# Patient Record
Sex: Male | Born: 1980 | Race: Black or African American | Hispanic: No | Marital: Single | Smoking: Never smoker
Health system: Southern US, Community
[De-identification: ages and names within clinical notes are randomized; demographics above are authoritative.]

## PROBLEM LIST (undated history)

## (undated) DIAGNOSIS — I10 Essential (primary) hypertension: Secondary | ICD-10-CM

## (undated) DIAGNOSIS — K219 Gastro-esophageal reflux disease without esophagitis: Secondary | ICD-10-CM

## (undated) DIAGNOSIS — J301 Allergic rhinitis due to pollen: Secondary | ICD-10-CM

## (undated) HISTORY — DX: Essential (primary) hypertension: I10

## (undated) HISTORY — DX: Allergic rhinitis due to pollen: J30.1

## (undated) HISTORY — DX: Gastro-esophageal reflux disease without esophagitis: K21.9

## (undated) HISTORY — PX: HIP SURGERY: SHX245

---
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
## 2001-03-15 ENCOUNTER — Emergency Department (HOSPITAL_COMMUNITY): Admission: EM | Admit: 2001-03-15 | Discharge: 2001-03-15 | Payer: Self-pay | Admitting: Emergency Medicine

## 2005-02-19 HISTORY — PX: ORIF FINGER FRACTURE: SHX2122

## 2005-02-19 HISTORY — PX: ANKLE SURGERY: SHX546

## 2005-02-19 HISTORY — PX: HIP SURGERY: SHX245

## 2005-02-22 ENCOUNTER — Inpatient Hospital Stay (HOSPITAL_COMMUNITY): Admission: AC | Admit: 2005-02-22 | Discharge: 2005-02-28 | Payer: Self-pay
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
## 2005-02-25 ENCOUNTER — Ambulatory Visit: Payer: Self-pay | Admitting: Cardiology
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
## 2005-02-27 ENCOUNTER — Encounter: Payer: Self-pay | Admitting: Cardiology

## 2005-02-27 ENCOUNTER — Ambulatory Visit: Payer: Self-pay | Admitting: Cardiology

## 2005-03-06 ENCOUNTER — Encounter: Admission: RE | Admit: 2005-03-06 | Discharge: 2005-03-06 | Payer: Self-pay | Admitting: Orthopedic Surgery
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
## 2005-03-11 ENCOUNTER — Inpatient Hospital Stay (HOSPITAL_COMMUNITY): Admission: RE | Admit: 2005-03-11 | Discharge: 2005-03-14 | Payer: Self-pay | Admitting: Orthopedic Surgery

## 2005-03-11 ENCOUNTER — Ambulatory Visit: Payer: Self-pay | Admitting: Physical Medicine & Rehabilitation
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
## 2005-03-28 ENCOUNTER — Encounter: Admission: RE | Admit: 2005-03-28 | Discharge: 2005-05-13 | Payer: Self-pay | Admitting: Orthopedic Surgery

## 2005-04-24 ENCOUNTER — Encounter: Admission: RE | Admit: 2005-04-24 | Discharge: 2005-04-24 | Payer: Self-pay | Admitting: Orthopedic Surgery
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
## 2005-06-04 ENCOUNTER — Encounter: Admission: RE | Admit: 2005-06-04 | Discharge: 2005-06-04 | Payer: Self-pay | Admitting: Orthopedic Surgery
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
## 2005-06-12 ENCOUNTER — Inpatient Hospital Stay (HOSPITAL_COMMUNITY): Admission: RE | Admit: 2005-06-12 | Discharge: 2005-06-14 | Payer: Self-pay | Admitting: Orthopedic Surgery

## 2005-06-12 ENCOUNTER — Ambulatory Visit: Payer: Self-pay | Admitting: Physical Medicine & Rehabilitation
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
## 2005-12-31 ENCOUNTER — Encounter: Admission: RE | Admit: 2005-12-31 | Discharge: 2005-12-31 | Payer: Self-pay | Admitting: Orthopedic Surgery

## 2005-12-31 IMAGING — CT CT EXTREM LOW W/O CM*L*
3 of 4 series · 13 of 46 positions shown, 19 images · IV contrast (agent unspecified)
Comparison: Plain films of [DATE], CT of [DATE].  Outside plain films dated [DATE].

CLINICAL DATA: Evaluate nonunion of left femoral neck fracture.
CT OF THE LEFT HIP WITHOUT CONTRAST:
TECHNIQUE: Contiguous axial images of the left hip without contrast.  Sagittal and coronal reformats as well as 3D reconstructions were constructed for perspective.

[Series 2: — · axial · 0.70mm/px · z∈[-318,-48]mm · 9 of 89 slices shown, 15 images]
[im 9/89  soft-tissue]
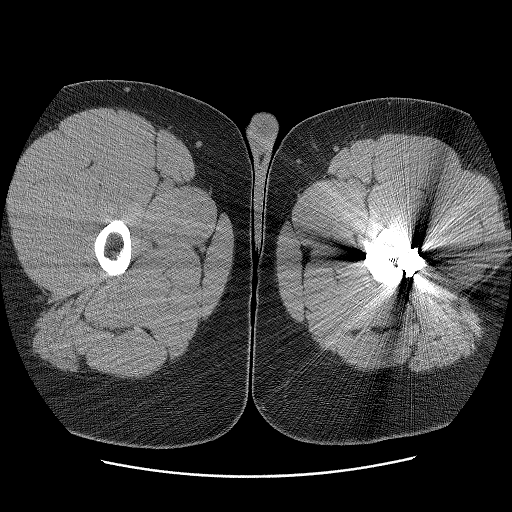
[im 9/89  bone]
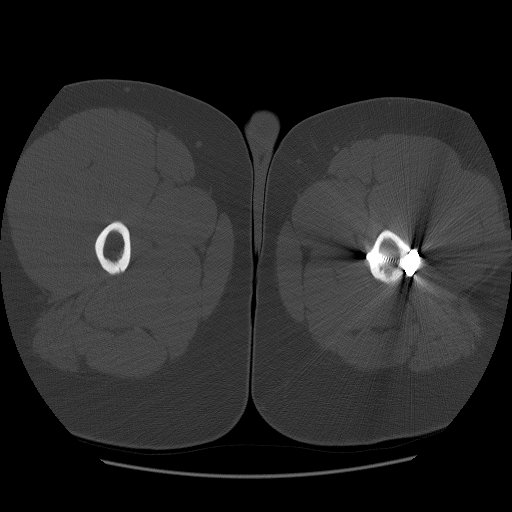
[im 18/89  soft-tissue]
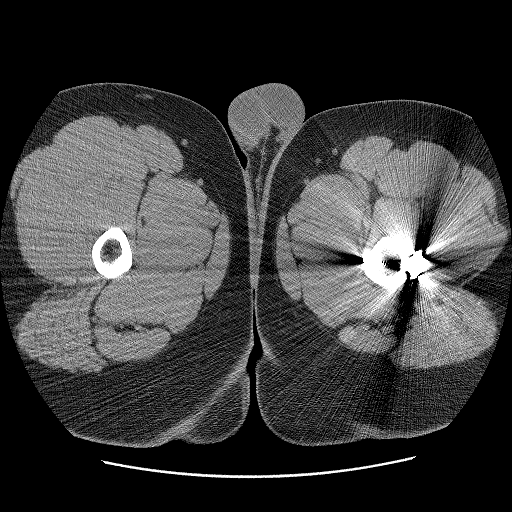
[im 27/89  soft-tissue]
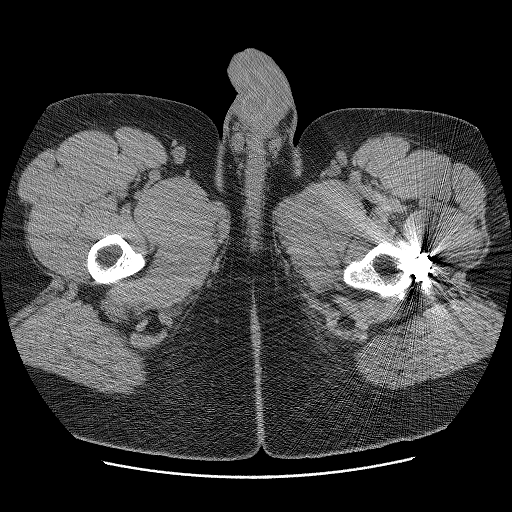
[im 36/89  soft-tissue]
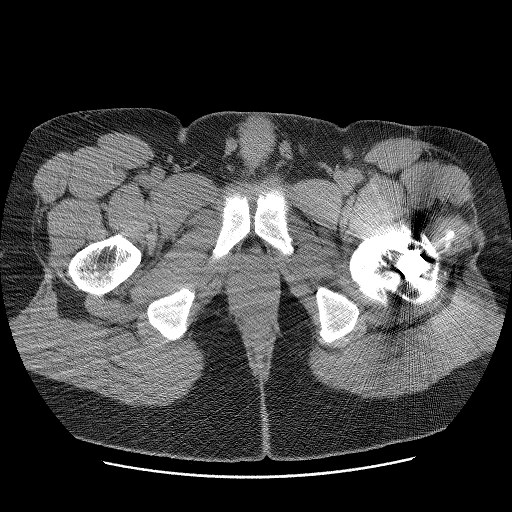
[im 45/89  soft-tissue]
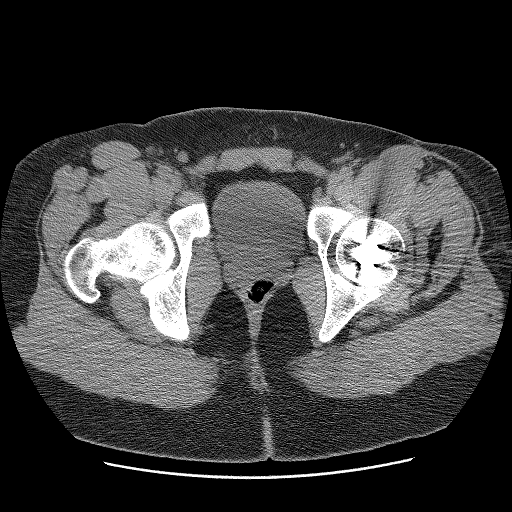
[im 53/89  soft-tissue]
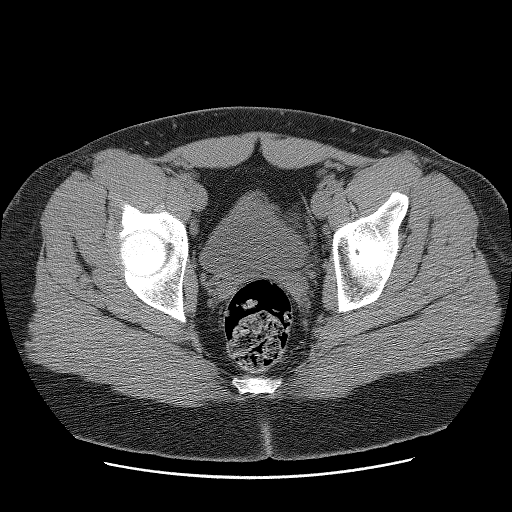
[im 53/89  lung]
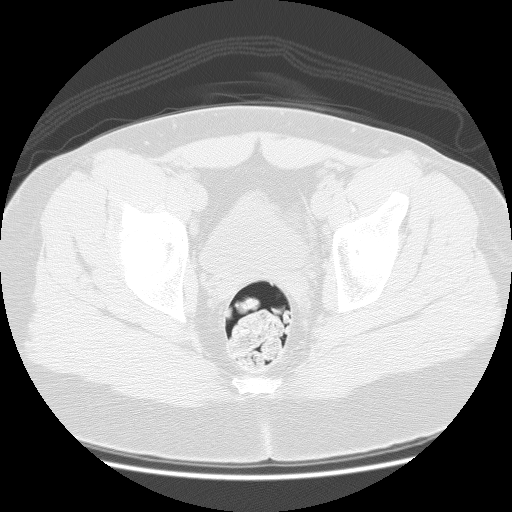
[im 62/89  soft-tissue]
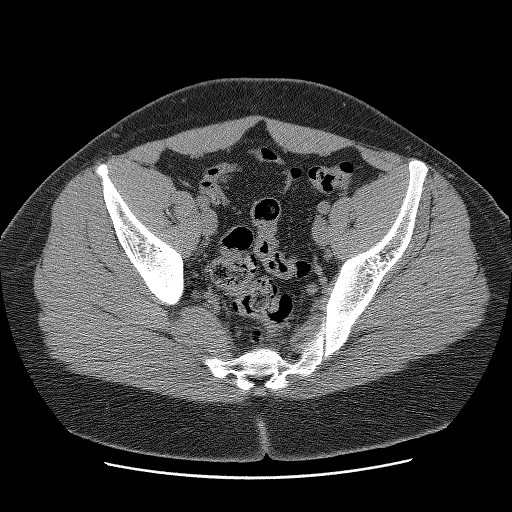
[im 62/89  lung]
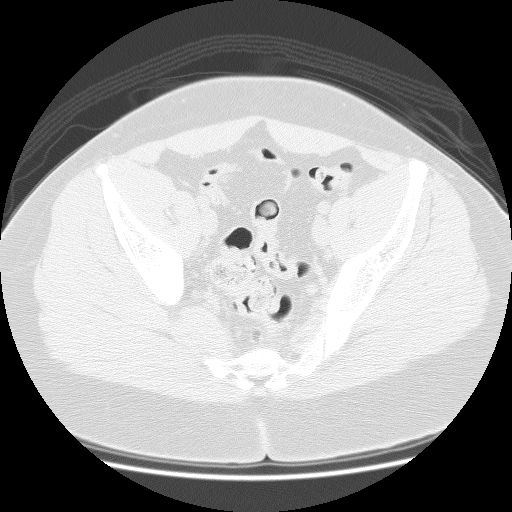
[im 71/89  soft-tissue]
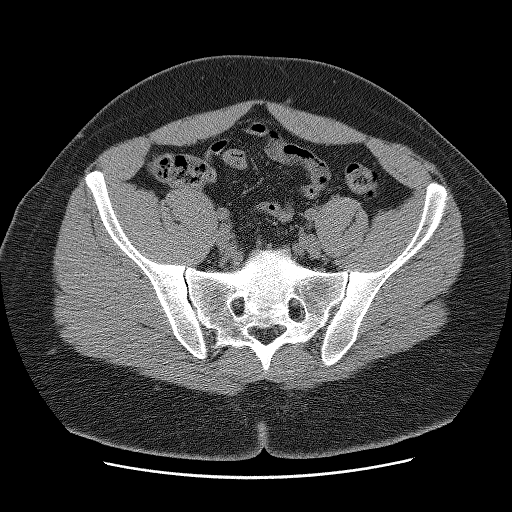
[im 71/89  lung]
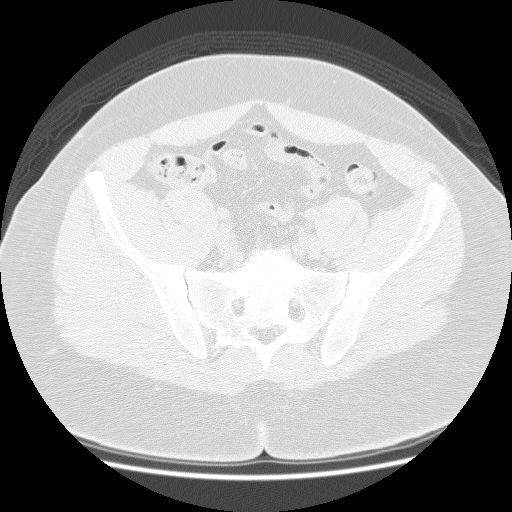
[im 80/89  soft-tissue]
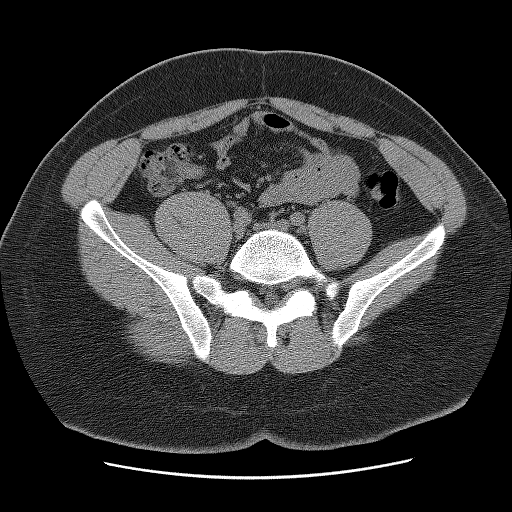
[im 80/89  lung]
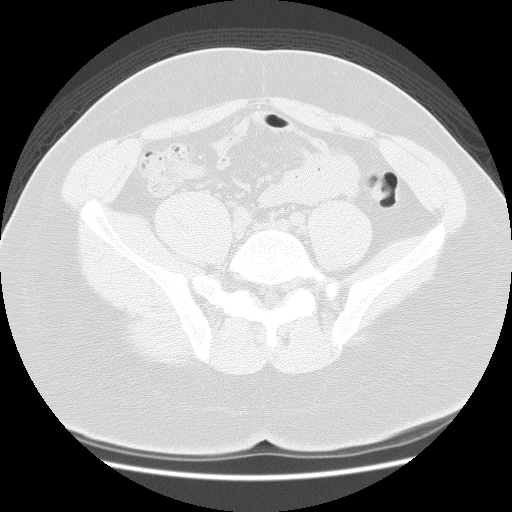
[im 80/89  bone]
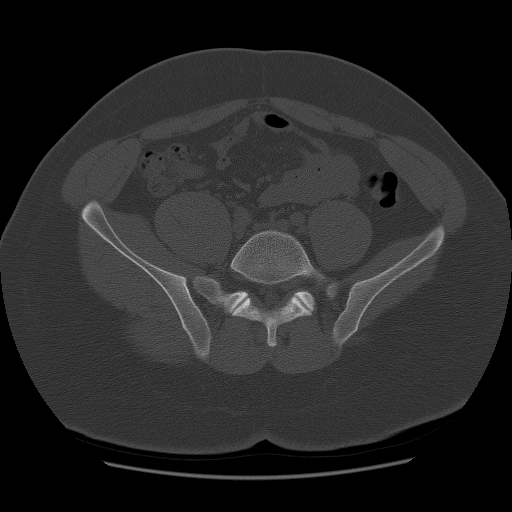

[Series 301: reformatted · sagittal · 0.41mm/px · 1 of 40 slices shown (1 of 2)]
[im 14/40  soft-tissue]
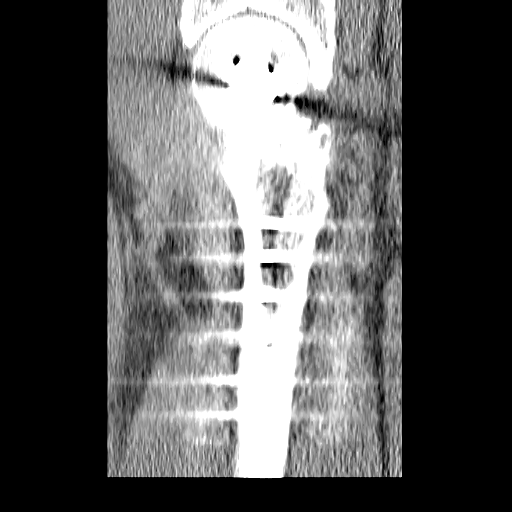

[Series 302: reformatted · coronal · 0.41mm/px · 3 of 40 slices shown (2 of 2)]
[im 14/40  soft-tissue]
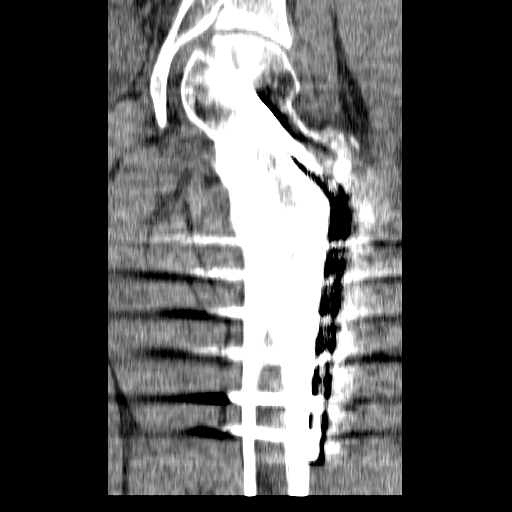
[im 18/40  soft-tissue]
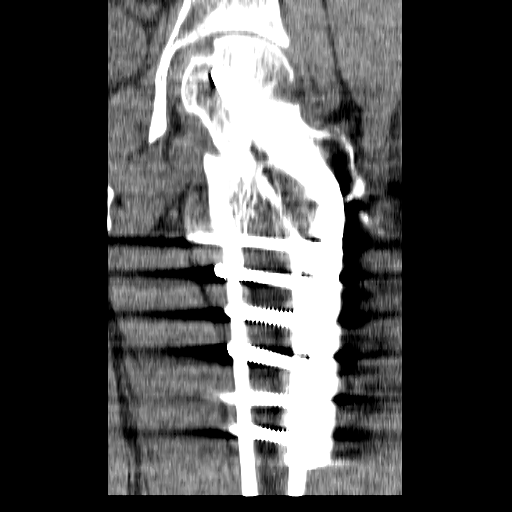
[im 22/40  soft-tissue]
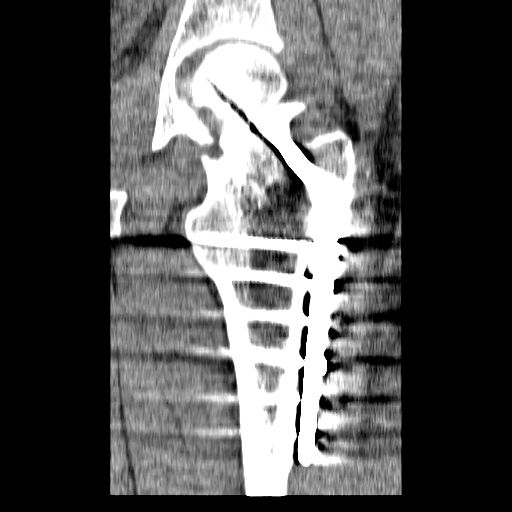

[13 of 46 positions shown; findings below may reference images not displayed]

FINDINGS: Since the prior CT, there has been interval replacement of lateral plate and screw fixator.There has been partial union identified.  There is a remaining fracture line identified about the medial subcapital left femur on the coronal reformats image #21.  This extends from a superolateral to inferomedial position.  There are extensive surrounding bony excrescences likely related to osteophytes and foci of heterotopic ossification.  There is no hardware complication.  No evidence of avascular necrosis of the femoral head.  No acute periprosthetic fracture.  The remainder of the pelvis and the right hip are within normal limits.
IMPRESSION: 1.  Near complete union of previously described subcapital left femoral nonunion.  A subtle remaining fracture line is seen medially (image 21 of coronal reformats).
2.  Status post pin and plate and screw fixation of proximal left femur without acute complication identified. 

3-DIMENSIONAL CT IMAGE RENDERING ON INDEPENDENT WORK STATION:
3-dimensional CT images were rendered by post-processing of the original CT data on an independent work station.  The 3-dimensional CT images were interpreted, and findings were reported in the accompanying complete CT report for this study.

## 2009-03-06 ENCOUNTER — Emergency Department (HOSPITAL_COMMUNITY): Admission: EM | Admit: 2009-03-06 | Discharge: 2009-03-06 | Payer: Self-pay | Admitting: Emergency Medicine
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
## 2010-11-01 NOTE — Op Note (Signed)
NAME:  Keith English, Keith English NO.:  [id number]   MEDICAL RECORD NO.:  [id number]          PATIENT TYPE:  INP   LOCATION:  5707                         FACILITY:  [facility]   PHYSICIAN:  Elana Alm. Thurston Hole, M.D. DATE OF BIRTH:  July 21, 1980   DATE OF PROCEDURE:  02/22/2005  DATE OF DISCHARGE:                                 OPERATIVE REPORT   PREOPERATIVE DIAGNOSES:  1.  Left hip femoral neck fracture.  2.  Prepatellar bursal laceration.  3.  Right fifth finger proximal phalanx fracture.   POSTOPERATIVE DIAGNOSES:  1.  Left hip femoral neck fracture.  2.  Prepatellar bursal laceration.  3.  Right fifth finger proximal phalanx fracture.   PROCEDURE:  1.  Open reduction internal fixation of left hip fracture using Synthes      compression hip screw.  2.  Left prepatellar bursal laceration irrigation and debridement.  3.  Right fifth finger proximal phalanx fracture splinting.   SURGEON:  Elana Alm. Thurston Hole, M.D.   ASSISTANT:  Julien Girt, P.A.   ANESTHESIA:  General.   OPERATIVE TIME:  One hour.   ESTIMATED BLOOD LOSS:  150 mL.   COMPLICATIONS:  None.   DESCRIPTION OF PROCEDURE:  Keith English was brought to operating room on  February 22, 2005, placed under general anesthesia on the stretcher and then  carefully transferred to the fracture table.  He had a Foley catheter placed  under sterile conditions and received Ancef 1 g IV preoperatively for  prophylaxis.  His left leg was placed in straight longitudinal traction and  well-padded and his right leg was placed in overhead suspension and padded  well.  His left hip and leg were then prepped using sterile DuraPrep and  draped using sterile technique.  Initially, using fluoroscopic control, a 7-  to 8-cm incision was made over the proximal lateral femur.  Underlying  subcutaneous tissues were incised with the line of the skin incision.  Iliotibial band and vastus lateralis fascia were incised longitudinally  revealing the underlying lateral proximal femur.  Using a 135-degree-angle  guide, a guide pin was then drilled up the lateral femoral cortex up the  femoral neck into the femoral head under AP and lateral fluoroscopic  control; this was measured when it was placed in the correct position and  found to be 85 mm in length.  It was then overdrilled with a step drill to  85 mm.  After this was done, then an 85-mm lag screw was screwed up into  position in the femoral neck and head under fluoroscopic control and then a  4-hole 135-degree-angle side plate was used, placed up into position and  secured onto the femoral shaft with 4 separate 4.5-mm cortical screws.  Anatomic reduction of the fracture and rigid internal fixation were achieved  and this was documented with fluoroscopy as well.  After this was done, the  wound was copiously irrigated.  The vastus lateralis fascia was closed with  0 Vicryl, the iliotibial band was closed with 0 Vicryl, subcutaneous tissues  closed with 0 and 2-0 Vicryl, skin closed with skin staples.  Sterile  dressings were applied.  The left knee prepatellar bursal laceration was  then thoroughly irrigated with saline and dressed sterilely.  The right  fifth finger was x-rayed with fluoro and found to have an oblique fracture  of the proximal phalanx.  An aluminum splint was placed on this for  stabilization, knowing that this fracture would need to be definitively  fixed with internal fixation at some point the future.  After this was done,  the patient was taken out of traction, found to have leg lengths equal,  rotation equal, pulses 2+ and symmetric.  He was then awakened, extubated,  and taken to the recovery room in stable condition.  Needle and sponge  counts were correct x2 at the end of the case.      Robert A. Thurston Hole, M.D.  Electronically Signed     RAW/MEDQ  D:  02/23/2005  T:  02/24/2005  Job:  [id number]

## 2010-11-01 NOTE — Op Note (Signed)
NAME:  BRITT, PETRONI NO.:  [id number]   MEDICAL RECORD NO.:  [id number]          PATIENT TYPE:  INP   LOCATION:  5732                         FACILITY:  [facility]   PHYSICIAN:  Elana Alm. Thurston Hole, M.D. DATE OF BIRTH:  10-23-1980   DATE OF PROCEDURE:  02/24/2005  DATE OF DISCHARGE:                                 OPERATIVE REPORT   PREOPERATIVE DIAGNOSIS:  Right fifth finger proximal phalanx fracture.   POSTOPERATIVE DIAGNOSIS:  Right fifth finger proximal phalanx fracture.   PROCEDURE:  Open reduction internal fixation of right fifth finger proximal  phalanx fracture.   SURGEON:  Elana Alm. Thurston Hole, M.D.   ASSISTANT:  Julien Girt, P.A.   ANESTHESIA:  General.   OPERATIVE TIME:  45 minutes.   COMPLICATIONS:  None.   DESCRIPTION OF PROCEDURE:  Mr. Godinho is brought to operating room on  February 24, 2005, placed under general anesthesia on his own bed and then  carefully transferred to the operative table. His right hand and arm was  prepped using sterile DuraPrep and draped using sterile technique. He  received Ancef 1 gram IV preoperatively for prophylaxis. The arm and hand  was exsanguinated and tourniquet elevated to 175 mm. Initially through a 3  cm longitudinal ulnar incision based along the ulnar aspect of the proximal  phalanx of the fifth finger, initial exposure was made.  The underlying  subcutaneous tissues were incised along with skin incision. The extensor  tendon was carefully lifted up and subperiosteally the fracture was exposed.  Carefully placed retractors were placed keeping the neurovascular bundle  protected. The fracture was then reduced with a clamp and held in place  while two separate 1.5 mm screws were placed from ulnar to radial using  standard AO lag screw technique. These screws, one was 12 mm and one was 10  mm, were screwed into position. AP and lateral fluoroscopic x-rays confirmed  anatomic reduction of fracture in  satisfactory position of screws. Finger  was also checked for alignment, rotation and found to be normal. At this  point the wound was irrigated and then the ulnar aspect of the extensor  tendon was reattached to the periosteum with 3-0 Vicryl. Subcutaneous  tissues closed with 3-0 Vicryl and skin closed with 4-0 nylon, sterile  dressings and a splint were applied. Tourniquet was released and the patient  awakened, extubated, and taken to recovery in stable condition. Needle,  sponge counts correct x2 at the end of the case.      Robert A. Thurston Hole, M.D.  Electronically Signed    RAW/MEDQ  D:  02/24/2005  T:  02/24/2005  Job:  [id number]
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
## 2010-11-01 NOTE — Discharge Summary (Signed)
NAME:  RAMIR, MALERBA NO.:  [id number]   MEDICAL RECORD NO.:  [id number]           PATIENT TYPE:   LOCATION:                                 FACILITY:   PHYSICIAN:  Doralee Albino. Carola Frost, M.D.      DATE OF BIRTH:   DATE OF ADMISSION:  DATE OF DISCHARGE:                                 DISCHARGE SUMMARY   ADMISSION DIAGNOSIS:  Left femoral neck nonunion, retained hardware.   DISCHARGE DIAGNOSIS:  Femoral neck nonunion status post proximal valgus  osteotomy.   PROCEDURES PERFORMED:  1.  Proximal femoral valgus osteotomy, December 28th.  2.  Removal of deep implant, December 28th.   BRIEF SUMMARY OF HOSPITAL COURSE:  Keith English is a 30 year old male well  known to the orthopedic trauma service for complications relating to a left  femoral neck fracture.  After discussion of the risks and benefits of  proximal femoral valgus osteotomy, the patient elected to proceed with this  in hopes of uniting his femoral neck fracture.  He was taken to the OR on  December 28th and underwent the procedure without complication.  Physical  therapy was consulted postoperatively to assist him with mobilization using  nonweightbearing on the left lower extremity.  He progressed well with  physical therapy despite a slight amount of lightheadedness on postop day 1.  At that time, his H&H was 8.9 and 26.9.  It did drift further down on  postoperative day 2 and he agreed to undergo 1 unit of transfusion to  address his acute blood loss anemia.  He felt excellent following the  transfusion and wished to be discharged.  As he was able to mobilize with  physical therapy without any symptoms on that day, we felt that would be  acceptable.  At that time, his wound looked excellent with no evidence of  infection or other concern.  His neurovascular status remained intact and  unchanged.  He was tolerating a regular diet without complication and had  well controlled pain.  He started Lovenox  for DVT prophylaxis on postop day  1.   DISCHARGE INSTRUCTIONS:  Mr. Morey may be touch-down weightbearing on the  left lower extremity.  For DVT prophylaxis, he may change to a baby aspirin  at discharge given his high level of mobility, which should continue for 6  weeks.  We will plan to see him back in the clinic in 10 days for removal of  his sutures.  He will contact us in the interim with any problems, concerns,  or questions.      Doralee Albino. Carola Frost, M.D.  Electronically Signed     MHH/MEDQ  D:  12/16/2005  T:  12/17/2005  Job:  [id number]

## 2011-09-28 ENCOUNTER — Ambulatory Visit (INDEPENDENT_AMBULATORY_CARE_PROVIDER_SITE_OTHER): Payer: 59 | Admitting: Family Medicine

## 2011-09-28 VITALS — BP 150/87 | HR 91 | Temp 98.4°F | Resp 16 | Ht 74.0 in | Wt 253.6 lb

## 2011-09-28 DIAGNOSIS — M545 Low back pain, unspecified: Secondary | ICD-10-CM

## 2011-09-28 DIAGNOSIS — Z113 Encounter for screening for infections with a predominantly sexual mode of transmission: Secondary | ICD-10-CM

## 2011-09-28 MED ORDER — CYCLOBENZAPRINE HCL 10 MG PO TABS: 10.0000 mg | ORAL_TABLET | Freq: Three times a day (TID) | ORAL | Status: AC | PRN
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
## 2011-09-30 LAB — GC/CHLAMYDIA PROBE AMP, URINE: GC Probe Amp, Urine: NEGATIVE

## 2011-09-30 LAB — HIV ANTIBODY (ROUTINE TESTING W REFLEX): HIV: NONREACTIVE

## 2011-09-30 LAB — RPR

## 2011-10-01 ENCOUNTER — Telehealth: Payer: Self-pay

## 2011-12-23 ENCOUNTER — Ambulatory Visit (INDEPENDENT_AMBULATORY_CARE_PROVIDER_SITE_OTHER): Payer: 59 | Admitting: Family Medicine

## 2011-12-23 VITALS — BP 135/85 | HR 68 | Temp 99.3°F | Resp 17 | Ht 74.0 in | Wt 252.0 lb

## 2011-12-23 DIAGNOSIS — R599 Enlarged lymph nodes, unspecified: Secondary | ICD-10-CM

## 2011-12-23 DIAGNOSIS — R591 Generalized enlarged lymph nodes: Secondary | ICD-10-CM

## 2011-12-23 DIAGNOSIS — Z113 Encounter for screening for infections with a predominantly sexual mode of transmission: Secondary | ICD-10-CM

## 2011-12-23 LAB — HIV ANTIBODY (ROUTINE TESTING W REFLEX): HIV: NONREACTIVE

## 2011-12-23 NOTE — Progress Notes (Signed)
[facility]:  Office Visit  Chief Complaint:  Chief Complaint  Patient presents with  . Edema    neck swelling 1 day   . Exposure to STD    HPI: Keith English is a 31 y.o. male who complains of : 1 day history left neck swelling. Recent URI sxs/allergies. No other sxs at this time. No treatment.   History reviewed. No pertinent past medical history. Past Surgical History  Procedure Date  . Ankle surgery   . Hip surgery     right side   History   Social History  . Marital Status: Single    Spouse Name: N/A    Number of Children: N/A  . Years of Education: N/A   Social History Main Topics  . Smoking status: Never Smoker   . Smokeless tobacco: None  . Alcohol Use: Yes  . Drug Use: No  . Sexually Active: None   Other Topics Concern  . None   Social History Narrative  . None   Family History  Problem Relation Age of Onset  . Hypertension Mother   . Hypertension Father    No Known Allergies Prior to Admission medications   Medication Sig Start Date End Date Taking? Authorizing Provider  traMADol (ULTRAM) 50 MG tablet Take 50 mg by mouth every 6 (six) hours as needed.    Historical Provider, MD     ROS: The patient denies fevers, chills, night sweats, unintentional weight loss, chest pain, palpitations, wheezing, dyspnea on exertion, nausea, vomiting, abdominal pain, dysuria, hematuria, melena, numbness, weakness, or tingling.  All other systems have been reviewed and were otherwise negative with the exception of those mentioned in the HPI and as above.    PHYSICAL EXAM: Filed Vitals:   12/23/11 1513  BP: 135/85  Pulse: 68  Temp: 99.3 F (37.4 C)  Resp: 17   Filed Vitals:   12/23/11 1513  Height: 6\' 2"  (1.88 m)  Weight: 252 lb (114.306 kg)   Body mass index is 32.35 kg/(m^2).  General: Alert, no acute distress HEENT:  Normocephalic, atraumatic, oropharynx patent. + left submandibular small 1-2 , 5mm LAD, tender, mobile on  left submand. No cervical LAD Cardiovascular:  Regular rate and rhythm, no rubs murmurs or gallops.  No Carotid bruits, radial pulse intact. No pedal edema.  Respiratory: Clear to auscultation bilaterally.  No wheezes, rales, or rhonchi.  No cyanosis, no use of accessory musculature GI: No organomegaly, abdomen is soft and non-tender, positive bowel sounds.  No masses. Skin: No rashes. Neurologic: Facial musculature symmetric. Psychiatric: Patient is appropriate throughout our interaction. Lymphatic: No cervical lymphadenopathy, thyroidmegaly Musculoskeletal: Gait intact.   LABS:   Results for orders placed in visit on 09/28/11  HIV ANTIBODY (ROUTINE TESTING)      Component Value Range   HIV NON REACTIVE  NON REACTIVE  RPR      Component Value Range   RPR NON REAC  NON REAC  GC/CHLAMYDIA PROBE AMP, URINE      Component Value Range   Chlamydia, Swab/Urine, PCR NEGATIVE  NEGATIVE   GC Probe Amp, Urine NEGATIVE  NEGATIVE     EKG/XRAY:   Primary read interpreted by Dr. Conley Rolls at [facility].   ASSESSMENT/PLAN: Encounter Diagnoses  Name Primary?  . Lymphadenopathy Yes  . Screening for STD (sexually transmitted disease)    Labs pending RPR, HIV, G/C Will monitor LAD, not worrisome at this time since very small and has only been present x 1 day, [  atient has had URI sxs in [place], [place], DO 12/23/2011 3:37 PM

## 2011-12-24 ENCOUNTER — Encounter: Payer: Self-pay | Admitting: Family Medicine

## 2011-12-24 ENCOUNTER — Telehealth: Payer: Self-pay | Admitting: Family Medicine

## 2011-12-24 LAB — GC/CHLAMYDIA PROBE AMP, URINE
Chlamydia, Swab/Urine, PCR: NEGATIVE
GC Probe Amp, Urine: NEGATIVE

## 2011-12-24 LAB — RPR

## 2011-12-24 NOTE — Telephone Encounter (Signed)
LM that all labs are negative. Generic messge.

## 2012-03-01 ENCOUNTER — Ambulatory Visit (INDEPENDENT_AMBULATORY_CARE_PROVIDER_SITE_OTHER): Payer: 59 | Admitting: Physician Assistant

## 2012-03-01 VITALS — BP 140/94 | HR 100 | Temp 98.5°F | Resp 18 | Ht 74.5 in | Wt 250.8 lb

## 2012-03-01 DIAGNOSIS — R51 Headache: Secondary | ICD-10-CM

## 2012-03-01 DIAGNOSIS — R519 Headache, unspecified: Secondary | ICD-10-CM

## 2012-03-01 DIAGNOSIS — I10 Essential (primary) hypertension: Secondary | ICD-10-CM

## 2012-03-01 DIAGNOSIS — J019 Acute sinusitis, unspecified: Secondary | ICD-10-CM

## 2012-03-01 MED ORDER — GUAIFENESIN ER 1200 MG PO TB12: 1.0000 | ORAL_TABLET | Freq: Two times a day (BID) | ORAL | Status: DC | PRN

## 2012-03-01 MED ORDER — IPRATROPIUM BROMIDE 0.03 % NA SOLN: 2.0000 | Freq: Two times a day (BID) | NASAL | Status: DC

## 2012-03-01 MED ORDER — PREDNISONE 20 MG PO TABS: ORAL_TABLET | ORAL | Status: DC

## 2012-03-01 MED ORDER — CEFDINIR 300 MG PO CAPS: 600.0000 mg | ORAL_CAPSULE | Freq: Every day | ORAL | Status: DC
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
## 2012-03-01 NOTE — Patient Instructions (Signed)
Get lots of rest and drink at least 64 ounces of water. Once you are well, and married, come back in so we can reassess your blood pressure.

## 2012-05-20 ENCOUNTER — Ambulatory Visit (INDEPENDENT_AMBULATORY_CARE_PROVIDER_SITE_OTHER): Payer: 59 | Admitting: Emergency Medicine

## 2012-05-20 VITALS — BP 148/99 | HR 88 | Temp 98.4°F | Resp 18 | Ht 74.0 in | Wt 256.4 lb

## 2012-05-20 DIAGNOSIS — R109 Unspecified abdominal pain: Secondary | ICD-10-CM

## 2012-05-20 LAB — POCT CBC
Granulocyte percent: 38 %G (ref 37–80)
MCHC: 29.5 g/dL — AB (ref 31.8–35.4)
MCV: 91.3 fL (ref 80–97)
MPV: 9.6 fL (ref 0–99.8)
RBC: 5.39 M/uL (ref 4.69–6.13)
RDW, POC: 12.6 %
WBC: 5.3 10*3/uL (ref 4.6–10.2)

## 2012-05-20 LAB — POCT URINALYSIS DIPSTICK
Bilirubin, UA: NEGATIVE
Blood, UA: NEGATIVE
Glucose, UA: NEGATIVE
Nitrite, UA: NEGATIVE
Spec Grav, UA: 1.025
Urobilinogen, UA: 0.2
pH, UA: 6.5

## 2012-05-20 LAB — POCT UA - MICROSCOPIC ONLY
Casts, Ur, LPF, POC: NEGATIVE
Crystals, Ur, HPF, POC: NEGATIVE
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
## 2012-05-21 LAB — HIV ANTIBODY (ROUTINE TESTING W REFLEX): HIV: NONREACTIVE

## 2012-05-24 NOTE — Progress Notes (Signed)
Reviewed and agree.

## 2012-06-03 ENCOUNTER — Ambulatory Visit (HOSPITAL_COMMUNITY): Payer: Self-pay

## 2012-08-18 ENCOUNTER — Telehealth: Payer: Self-pay

## 2012-08-18 NOTE — Telephone Encounter (Signed)
An ultrasound was ordered in Dec 2013, did this not happen?

## 2012-08-18 NOTE — Telephone Encounter (Signed)
Pt would  Like a nurse to call him regarding getting a referral to [facility]

## 2012-08-18 NOTE — Telephone Encounter (Signed)
[facility] for what? paitient c/ o discomfort under rib cage on right side. Wants to know if he can get this area imaged to find out what is causing this pain; please advise.

## 2012-08-19 NOTE — Telephone Encounter (Signed)
I have called patient to advise, he will come in tomorrow.

## 2012-08-19 NOTE — Telephone Encounter (Signed)
Unfortunately, I think it would be best if he came back in for a recheck before reordering this.

## 2012-08-19 NOTE — Telephone Encounter (Signed)
No he did not go, can we reorder this? Or does he need to return to clinic?

## 2012-08-26 ENCOUNTER — Ambulatory Visit (INDEPENDENT_AMBULATORY_CARE_PROVIDER_SITE_OTHER): Payer: 59 | Admitting: Family Medicine

## 2012-08-26 VITALS — BP 158/90 | HR 84 | Temp 98.4°F | Resp 16 | Ht 74.0 in | Wt 256.0 lb

## 2012-08-26 DIAGNOSIS — R1011 Right upper quadrant pain: Secondary | ICD-10-CM
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
## 2012-08-26 NOTE — Patient Instructions (Addendum)
We'll get you set up with ultrasound.    Blood test today.

## 2012-08-27 LAB — COMPREHENSIVE METABOLIC PANEL
AST: 20 U/L (ref 0–37)
CO2: 24 mEq/L (ref 19–32)
Calcium: 9.5 mg/dL (ref 8.4–10.5)
Chloride: 105 mEq/L (ref 96–112)
Creat: 0.91 mg/dL (ref 0.50–1.35)
Potassium: 4 mEq/L (ref 3.5–5.3)
Sodium: 137 mEq/L (ref 135–145)
Total Bilirubin: 1.5 mg/dL — ABNORMAL HIGH (ref 0.3–1.2)
Total Protein: 8 g/dL (ref 6.0–8.3)

## 2012-08-30 ENCOUNTER — Ambulatory Visit
Admission: RE | Admit: 2012-08-30 | Discharge: 2012-08-30 | Disposition: A | Discharge status: Home / Self Care | Payer: 59 | Source: Ambulatory Visit | Attending: Family Medicine | Admitting: Family Medicine

## 2012-08-30 DIAGNOSIS — R1011 Right upper quadrant pain: Secondary | ICD-10-CM

## 2012-08-30 IMAGING — US US ABDOMEN COMPLETE
1 series · 14 of 25 positions shown · non-contrast
Comparison: CT abdomen pelvis of [DATE]

CLINICAL DATA: Right upper quadrant abdominal pain for several
months, worse after eating

COMPLETE ABDOMINAL ULTRASOUND

[Series 1: us abdomen complete · 0.37mm/px · 14 of 78 slices shown]
[im 1/78]
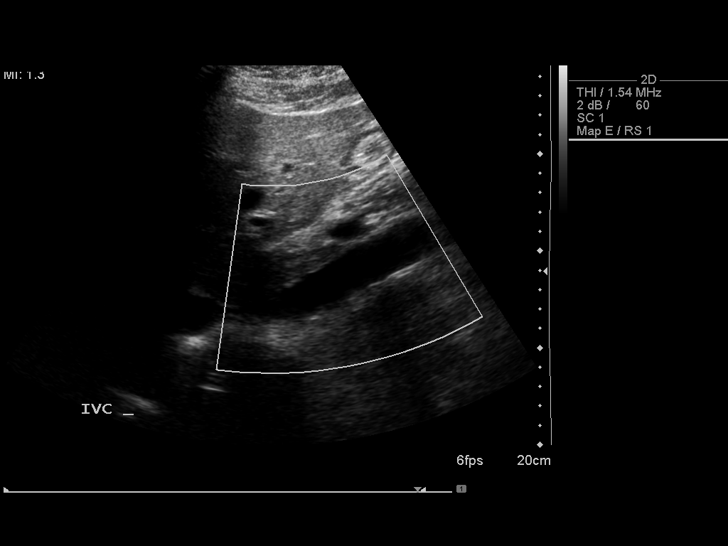
[im 7/78]
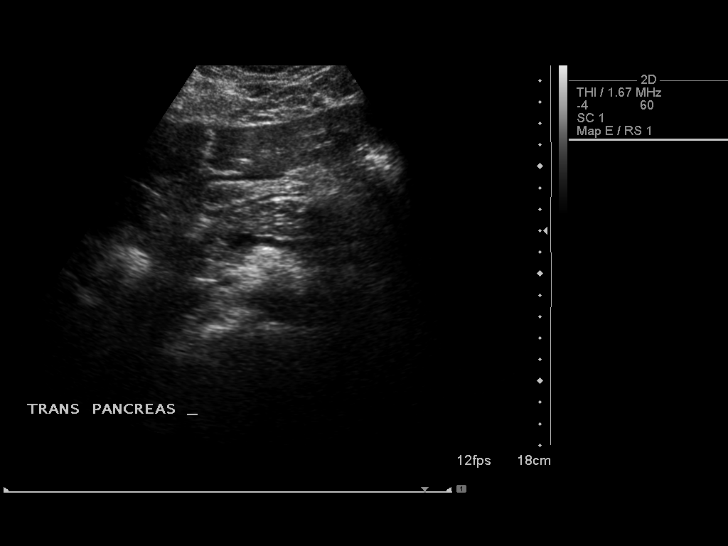
[im 13/78]
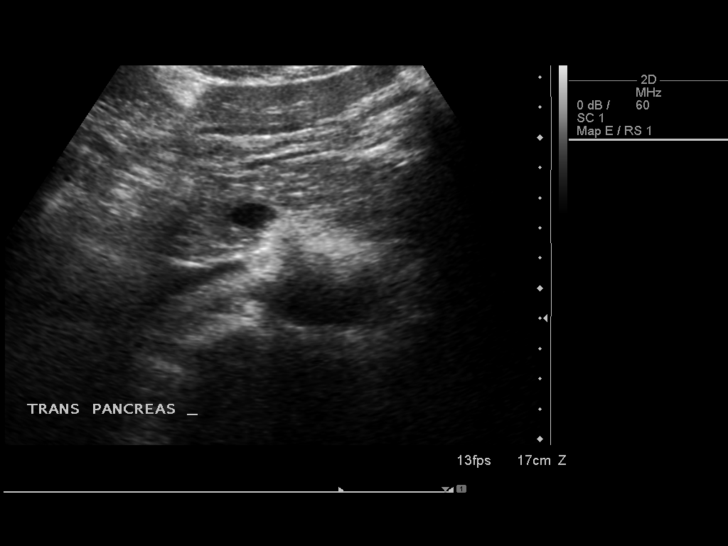
[im 20/78]
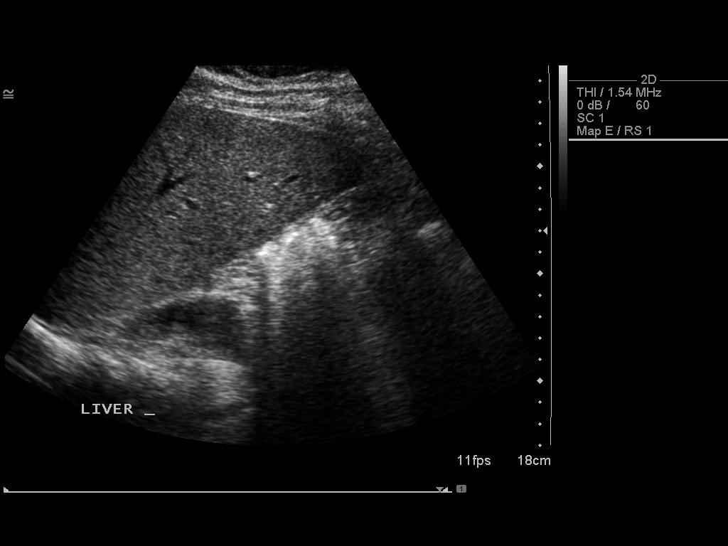
[im 26/78]
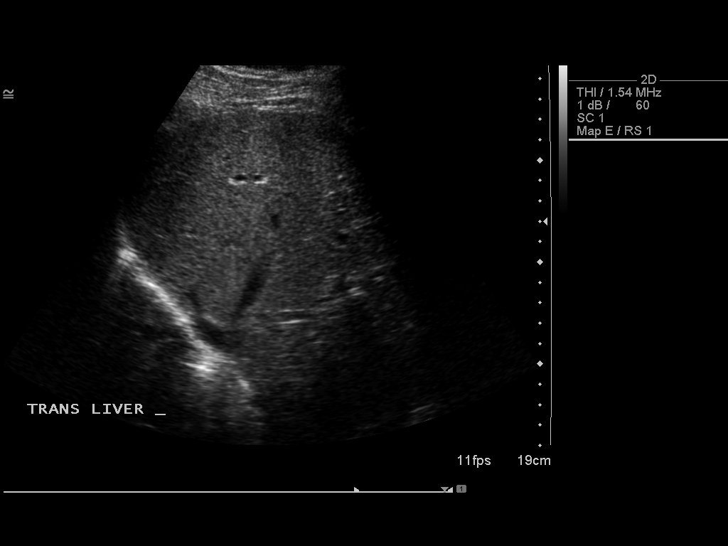
[im 29/78]
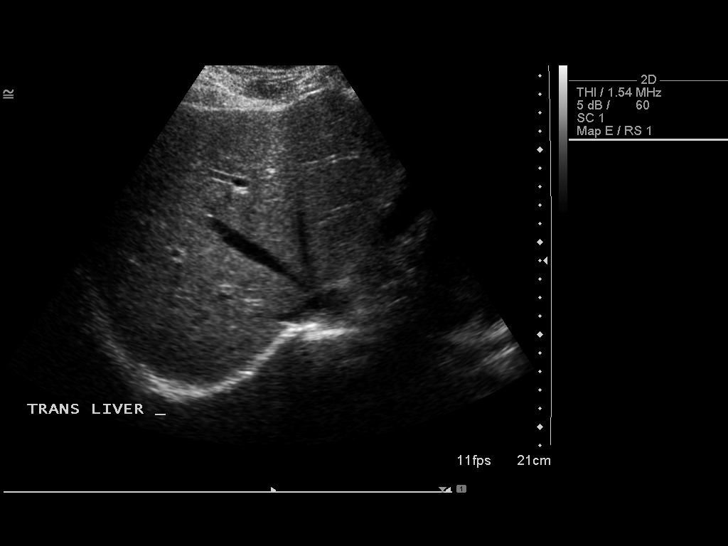
[im 36/78]
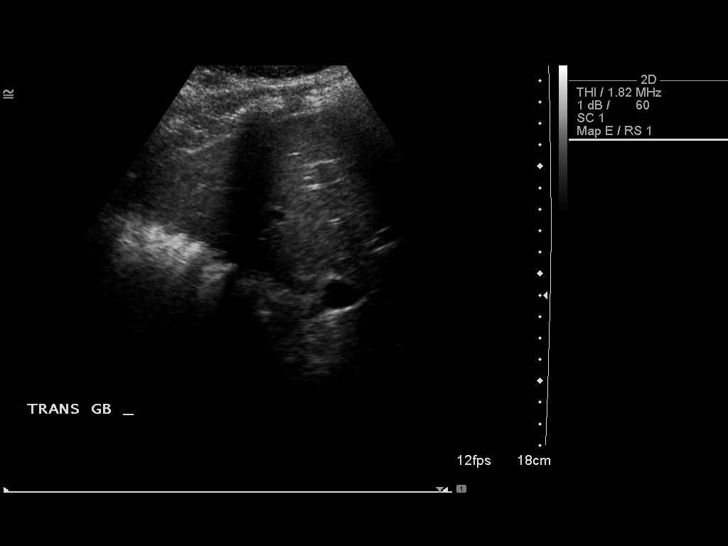
[im 42/78]
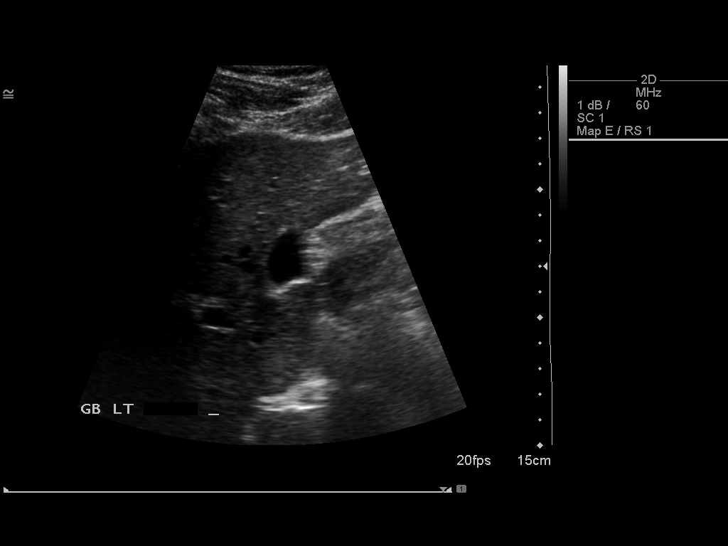
[im 49/78]
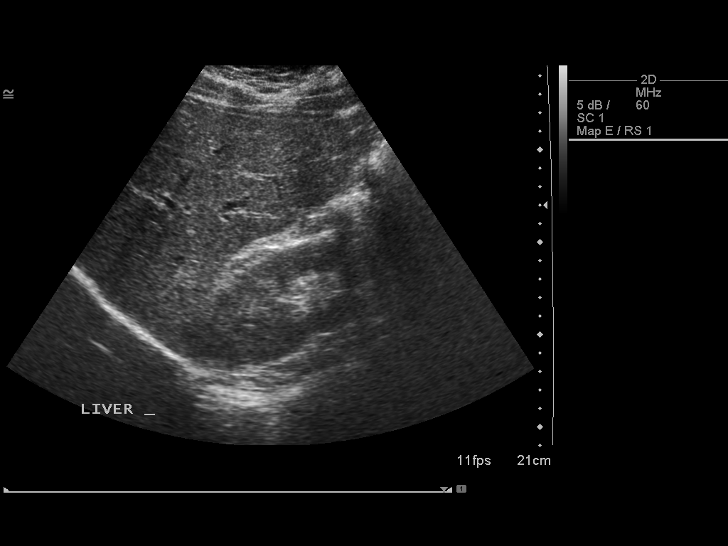
[im 52/78]
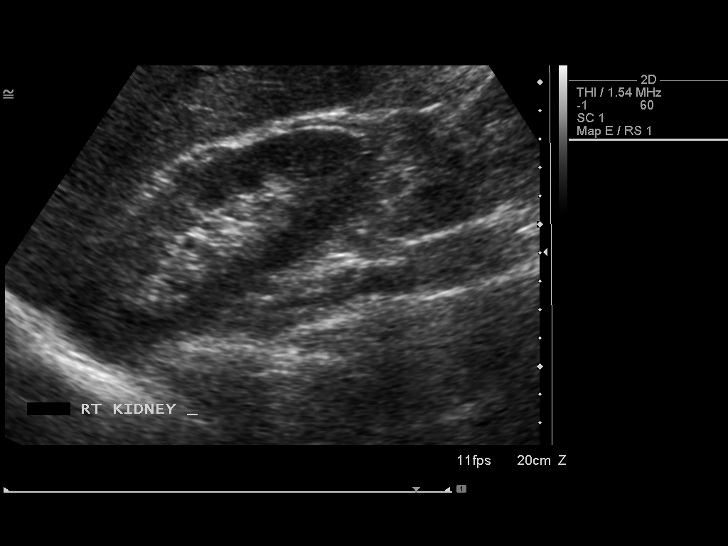
[im 58/78]
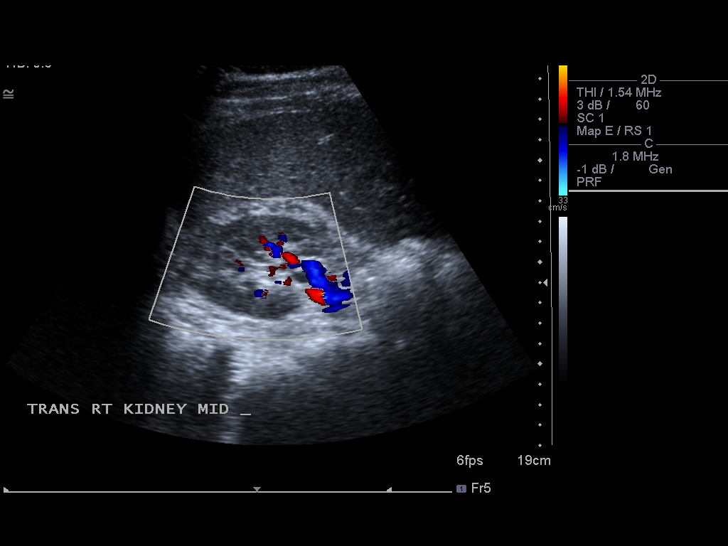
[im 65/78]
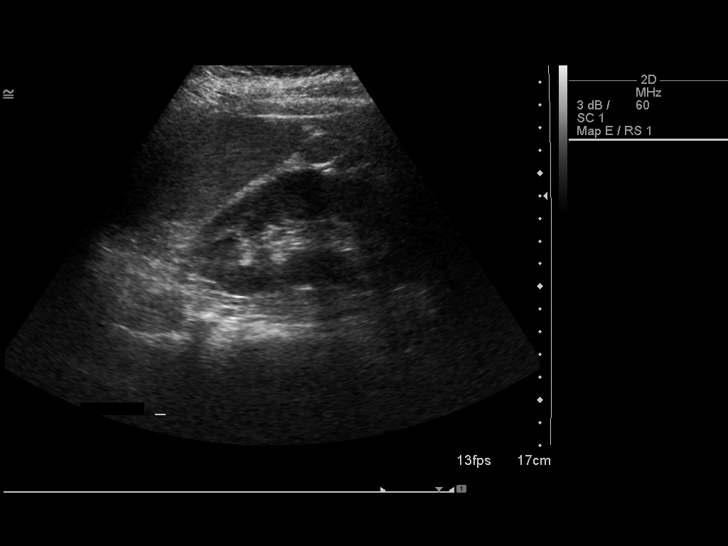
[im 71/78]
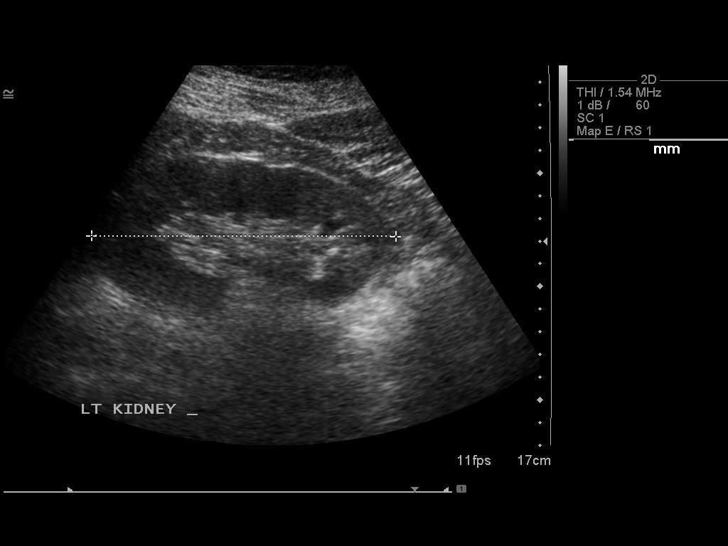
[im 78/78]
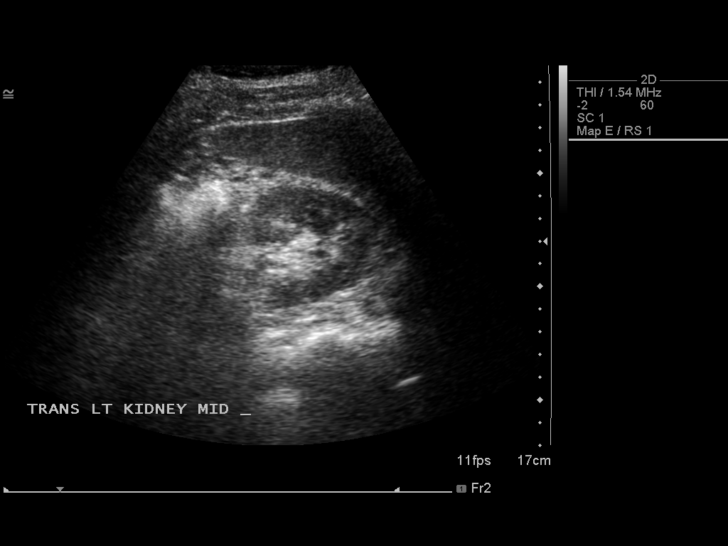

[14 of 25 positions shown; findings below may reference images not displayed]

FINDINGS: Gallbladder:  The gallbladder is well visualized and no gallstones
are noted.  There is no pain over the gallbladder with compression.

Common bile duct:  The common bile duct is normal measuring 4.8 mm
in maximum diameter.

Liver:  The liver is echogenic consistent with fatty infiltration.
The liver is also slightly prominent measuring 17.2 cm sagittally.

IVC:  The IVC is unremarkable.

Pancreas:  The pancreas is largely obscured by bowel gas.

Spleen:  The spleen is normal measuring 10.3 cm sagittally with a
probable splenule medially of 1.6 cm.

Right Kidney:  No hydronephrosis is seen.  The right kidney
measures 12.2 cm sagittally.

Left Kidney:  No hydronephrosis is noted.  The left kidney measures
13.4 cm.

Abdominal aorta:  The abdominal aorta is normal in caliber.
IMPRESSION: 1.  Echogenic liver parenchyma consistent with fatty infiltration.
2.  No gallstones.
3.  Much of the pancreatic head and tail is obscured by bowel gas.

## 2012-09-01 ENCOUNTER — Telehealth: Payer: Self-pay

## 2012-09-01 DIAGNOSIS — R1011 Right upper quadrant pain: Secondary | ICD-10-CM

## 2012-09-01 NOTE — Telephone Encounter (Signed)
Patient is asking for his results from Scan done on Monday.   Please call [phone]

## 2012-09-01 NOTE — Telephone Encounter (Signed)
Notes Recorded by Tobey Grim, MD on 08/30/2012 at 1:55 PM Negative [place] for gallstones. Also negative labwork. Possibly indicative of pancreatitis. If pain continues, we could consider further labwork for elevated triglycerides/lipase vs referral to a surgeon. Would you guys mind calling patient to see if a) he's still having symptoms and b) if which next step he would prefer? Thanks, Trey Paula Abdominal pain has eased but feels some pain at times. Feels tight. Referral to general surgery made for patient . To you FYI

## 2012-09-06 ENCOUNTER — Ambulatory Visit (INDEPENDENT_AMBULATORY_CARE_PROVIDER_SITE_OTHER): Payer: 59 | Admitting: General Surgery

## 2012-09-09 ENCOUNTER — Other Ambulatory Visit: Payer: Self-pay | Admitting: Radiology

## 2012-09-09 ENCOUNTER — Telehealth (INDEPENDENT_AMBULATORY_CARE_PROVIDER_SITE_OTHER): Payer: Self-pay

## 2012-09-09 DIAGNOSIS — K859 Acute pancreatitis without necrosis or infection, unspecified: Secondary | ICD-10-CM

## 2012-09-09 NOTE — Telephone Encounter (Signed)
I spoke with Amy at Urgent care re: pt appt. Pt will be referred to GI for work up to r/o pancreatitis and office visit with Dr Gerrit Friends will be cancelled at this time. I advised her if a surgical indicator is dx we will be glad to see pt.

## 2012-09-22 ENCOUNTER — Ambulatory Visit (INDEPENDENT_AMBULATORY_CARE_PROVIDER_SITE_OTHER): Payer: 59 | Admitting: Surgery

## 2013-03-25 ENCOUNTER — Ambulatory Visit (INDEPENDENT_AMBULATORY_CARE_PROVIDER_SITE_OTHER): Payer: 59 | Admitting: Physician Assistant

## 2013-03-25 VITALS — BP 142/92 | HR 90 | Temp 99.0°F | Resp 16 | Ht 75.0 in | Wt 271.4 lb

## 2013-03-25 DIAGNOSIS — J029 Acute pharyngitis, unspecified: Secondary | ICD-10-CM

## 2013-03-25 DIAGNOSIS — R509 Fever, unspecified: Secondary | ICD-10-CM

## 2013-03-25 LAB — POCT RAPID STREP A (OFFICE): Rapid Strep A Screen: NEGATIVE

## 2013-03-25 MED ORDER — FIRST-DUKES MOUTHWASH MT SUSP: 5.0000 mL | OROMUCOSAL | Status: DC | PRN

## 2013-03-25 MED ORDER — AMOXICILLIN 875 MG PO TABS: 875.0000 mg | ORAL_TABLET | Freq: Two times a day (BID) | ORAL | Status: DC

## 2013-03-25 NOTE — Progress Notes (Signed)
  Subjective:    Patient ID: Keith English, male    DOB: [birth year], 32 y.o.   MRN: [id number]  Headache  Associated symptoms include a fever and a sore throat. Pertinent negatives include no coughing, dizziness, ear pain, nausea, rhinorrhea, sinus pressure or vomiting.   32 year old male presents with for evaluation of sore throat. He has had a sore throat for 2 days then last night developed fatigue, headache, and fever.  Admits to low grade fever of 99.3 through the night that has continued today.  Denies nasal congestion, cough, otalgia, nausea, vomiting, SOB, wheezing, or dizziness.  No known strep exposure but does have a wife and daughter that have been sick with similar illnesses.   Patient is otherwise doing well with no other concerns today.     Review of Systems  Constitutional: Positive for fever and chills.  HENT: Positive for sore throat. Negative for congestion, ear pain, postnasal drip, rhinorrhea, sinus pressure and trouble swallowing.   Respiratory: Negative for cough, shortness of breath and wheezing.   Gastrointestinal: Negative for nausea and vomiting.  Neurological: Positive for headaches. Negative for dizziness.       Objective:   Physical Exam  Constitutional: He is oriented to person, place, and time. He appears well-developed and well-nourished.  HENT:  Head: Normocephalic and atraumatic.  Right Ear: Hearing, tympanic membrane, external ear and ear canal normal.  Left Ear: Hearing, tympanic membrane, external ear and ear canal normal.  Mouth/Throat: Uvula is midline and mucous membranes are normal. Oropharyngeal exudate and posterior oropharyngeal erythema (2+ tonsillar swelling) present. No posterior oropharyngeal edema or tonsillar abscesses.  Eyes: Conjunctivae are normal.  Neck: Normal range of motion. Neck supple.  Cardiovascular: Normal rate, regular rhythm and normal heart sounds.   Pulmonary/Chest: Effort normal and breath sounds normal.   Lymphadenopathy:    He has cervical adenopathy.  Neurological: He is alert and oriented to person, place, and time.  Psychiatric: He has a normal mood and affect. His behavior is normal. Judgment and thought content normal.          Assessment & Plan:  Acute pharyngitis - Plan: POCT rapid strep A, amoxicillin (AMOXIL) 875 MG tablet, Diphenhyd-Hydrocort-Nystatin (FIRST-DUKES MOUTHWASH) SUSP, Culture, Group A Strep Throat culture sent Despite negative rapid strep, will go ahead and treat due to symptoms Start amoxicillin 875 mg bid x 10 days [facility]'s mouthwash q2-3 hours prn pain Increase fluids and rest Tylenol or Motrin as needed for pain Follow up if symptoms worsen or fail to improve.

## 2013-03-25 NOTE — Patient Instructions (Signed)
Take up to Ibuprofen 800 mg three times daily and Tylenol 1000 mg twice daily

## 2013-03-27 LAB — CULTURE, GROUP A STREP: Organism ID, Bacteria: NORMAL

## 2013-04-15 ENCOUNTER — Telehealth: Payer: Self-pay

## 2013-04-15 MED ORDER — IPRATROPIUM BROMIDE 0.03 % NA SOLN: 2.0000 | Freq: Two times a day (BID) | NASAL | Status: DC

## 2013-04-15 NOTE — Telephone Encounter (Signed)
Rx for atrovent nasal spray sent to pharmacy. Also recommend he take zyrtec daily to help with nasal drainsage.

## 2013-04-15 NOTE — Telephone Encounter (Signed)
Nasal drainage, throat scratchy, cough with tickle, dry throat, has tried Mucinex and Prednisone and neither has helped. Can we prescribe him something else to use, please.  Thanks!

## 2013-04-15 NOTE — Telephone Encounter (Signed)
Patient wants refill of prednisone for sinus infection.

## 2013-04-16 NOTE — Telephone Encounter (Signed)
Pt advised to take meds in notes and to get OTC corcidin because he has HTN

## 2013-04-20 ENCOUNTER — Ambulatory Visit (INDEPENDENT_AMBULATORY_CARE_PROVIDER_SITE_OTHER): Payer: 59 | Admitting: Internal Medicine

## 2013-04-20 VITALS — BP 128/94 | HR 84 | Temp 99.5°F | Resp 18 | Wt 263.0 lb

## 2013-04-20 DIAGNOSIS — Z760 Encounter for issue of repeat prescription: Secondary | ICD-10-CM

## 2013-04-20 DIAGNOSIS — R059 Cough, unspecified: Secondary | ICD-10-CM

## 2013-04-20 DIAGNOSIS — I10 Essential (primary) hypertension: Secondary | ICD-10-CM

## 2013-04-20 DIAGNOSIS — R05 Cough: Secondary | ICD-10-CM

## 2013-04-20 DIAGNOSIS — J310 Chronic rhinitis: Secondary | ICD-10-CM

## 2013-04-20 LAB — BASIC METABOLIC PANEL
BUN: 15 mg/dL (ref 6–23)
CO2: 26 mEq/L (ref 19–32)
Chloride: 100 mEq/L (ref 96–112)
Creat: 0.89 mg/dL (ref 0.50–1.35)
Glucose, Bld: 89 mg/dL (ref 70–99)
Potassium: 4.4 mEq/L (ref 3.5–5.3)
Sodium: 135 mEq/L (ref 135–145)

## 2013-04-20 MED ORDER — CETIRIZINE HCL 10 MG PO TABS: 10.0000 mg | ORAL_TABLET | Freq: Every day | ORAL | Status: DC

## 2013-04-20 MED ORDER — LISINOPRIL-HYDROCHLOROTHIAZIDE 20-12.5 MG PO TABS: 1.0000 | ORAL_TABLET | Freq: Every day | ORAL | Status: DC

## 2013-04-20 MED ORDER — AZITHROMYCIN 500 MG PO TABS: 500.0000 mg | ORAL_TABLET | Freq: Every day | ORAL | Status: DC

## 2013-04-20 NOTE — Patient Instructions (Signed)
Sinusitis Sinusitis is redness, soreness, and swelling (inflammation) of the paranasal sinuses. Paranasal sinuses are air pockets within the bones of your face (beneath the eyes, the middle of the forehead, or above the eyes). In healthy paranasal sinuses, mucus is able to drain out, and air is able to circulate through them by way of your nose. However, when your paranasal sinuses are inflamed, mucus and air can become trapped. This can allow bacteria and other germs to grow and cause infection. Sinusitis can develop quickly and last only a short time (acute) or continue over a long period (chronic). Sinusitis that lasts for more than 12 weeks is considered chronic.  CAUSES  Causes of sinusitis include:  Allergies.  Structural abnormalities, such as displacement of the cartilage that separates your nostrils (deviated septum), which can decrease the air flow through your nose and sinuses and affect sinus drainage.  Functional abnormalities, such as when the small hairs (cilia) that line your sinuses and help remove mucus do not work properly or are not present. SYMPTOMS  Symptoms of acute and chronic sinusitis are the same. The primary symptoms are pain and pressure around the affected sinuses. Other symptoms include:  Upper toothache.  Earache.  Headache.  Bad breath.  Decreased sense of smell and taste.  A cough, which worsens when you are lying flat.  Fatigue.  Fever.  Thick drainage from your nose, which often is green and may contain pus (purulent).  Swelling and warmth over the affected sinuses. DIAGNOSIS  Your caregiver will perform a physical exam. During the exam, your caregiver may:  Look in your nose for signs of abnormal growths in your nostrils (nasal polyps).  Tap over the affected sinus to check for signs of infection.  View the inside of your sinuses (endoscopy) with a special imaging device with a light attached (endoscope), which is inserted into your  sinuses. If your caregiver suspects that you have chronic sinusitis, one or more of the following tests may be recommended:  Allergy tests.  Nasal culture A sample of mucus is taken from your nose and sent to a lab and screened for bacteria.  Nasal cytology A sample of mucus is taken from your nose and examined by your caregiver to determine if your sinusitis is related to an allergy. TREATMENT  Most cases of acute sinusitis are related to a viral infection and will resolve on their own within 10 days. Sometimes medicines are prescribed to help relieve symptoms (pain medicine, decongestants, nasal steroid sprays, or saline sprays).  However, for sinusitis related to a bacterial infection, your caregiver will prescribe antibiotic medicines. These are medicines that will help kill the bacteria causing the infection.  Rarely, sinusitis is caused by a fungal infection. In theses cases, your caregiver will prescribe antifungal medicine. For some cases of chronic sinusitis, surgery is needed. Generally, these are cases in which sinusitis recurs more than 3 times per year, despite other treatments. HOME CARE INSTRUCTIONS   Drink plenty of water. Water helps thin the mucus so your sinuses can drain more easily.  Use a humidifier.  Inhale steam 3 to 4 times a day (for example, sit in the bathroom with the shower running).  Apply a warm, moist washcloth to your face 3 to 4 times a day, or as directed by your caregiver.  Use saline nasal sprays to help moisten and clean your sinuses.  Take over-the-counter or prescription medicines for pain, discomfort, or fever only as directed by your caregiver. SEEK IMMEDIATE MEDICAL   CARE IF:  You have increasing pain or severe headaches.  You have nausea, vomiting, or drowsiness.  You have swelling around your face.  You have vision problems.  You have a stiff neck.  You have difficulty breathing. MAKE SURE YOU:   Understand these  instructions.  Will watch your condition.  Will get help right away if you are not doing well or get worse. Document Released: 06/02/2005 Document Revised: 08/25/2011 Document Reviewed: 06/17/2011 [facility] Patient Information 2014 [place], [place]. DASH Diet The DASH diet stands for "Dietary Approaches to Stop Hypertension." It is a healthy eating plan that has been shown to reduce high blood pressure (hypertension) in as little as 14 days, while also possibly providing other significant health benefits. These other health benefits include reducing the risk of breast cancer after menopause and reducing the risk of type 2 diabetes, heart disease, colon cancer, and stroke. Health benefits also include weight loss and slowing kidney failure in patients with chronic kidney disease.  DIET GUIDELINES  Limit salt (sodium). Your diet should contain less than 1500 mg of sodium daily.  Limit refined or processed carbohydrates. Your diet should include mostly whole grains. Desserts and added sugars should be used sparingly.  Include small amounts of heart-healthy fats. These types of fats include nuts, oils, and tub margarine. Limit saturated and trans fats. These fats have been shown to be harmful in the body. CHOOSING FOODS  The following food groups are based on a 2000 calorie diet. See your Registered Dietitian for individual calorie needs. Grains and Grain Products (6 to 8 servings daily)  Eat More Often: Whole-wheat bread, brown rice, whole-grain or wheat pasta, quinoa, popcorn without added fat or salt (air popped).  Eat Less Often: White bread, white pasta, white rice, cornbread. Vegetables (4 to 5 servings daily)  Eat More Often: Fresh, frozen, and canned vegetables. Vegetables may be raw, steamed, roasted, or grilled with a minimal amount of fat.  Eat Less Often/Avoid: Creamed or fried vegetables. Vegetables in a cheese sauce. Fruit (4 to 5 servings daily)  Eat More Often: All fresh,  canned (in natural juice), or frozen fruits. Dried fruits without added sugar. One hundred percent fruit juice ( cup [237 mL] daily).  Eat Less Often: Dried fruits with added sugar. Canned fruit in light or heavy syrup. [employer], Fish, and Poultry (2 servings or less daily. One serving is 3 to 4 oz [85-114 g]).  Eat More Often: Ninety percent or leaner ground beef, tenderloin, sirloin. Round cuts of beef, chicken breast, [place] breast. All fish. Grill, bake, or broil your meat. Nothing should be fried.  Eat Less Often/Avoid: Fatty cuts of meat, [place], or chicken leg, thigh, or wing. Fried cuts of meat or fish. Dairy (2 to 3 servings)  Eat More Often: Low-fat or fat-free milk, low-fat plain or light yogurt, reduced-fat or part-skim cheese.  Eat Less Often/Avoid: Milk (whole, 2%).Whole milk yogurt. Full-fat cheeses. Nuts, Seeds, and Legumes (4 to 5 servings per week)  Eat More Often: All without added salt.  Eat Less Often/Avoid: Salted nuts and seeds, canned beans with added salt. Fats and Sweets (limited)  Eat More Often: Vegetable oils, tub margarines without trans fats, sugar-free gelatin. Mayonnaise and salad dressings.  Eat Less Often/Avoid: Coconut oils, palm oils, butter, stick margarine, cream, half and half, cookies, candy, pie. FOR MORE INFORMATION The Dash Diet Eating Plan: www.dashdiet.org Document Released: 05/22/2011 Document Revised: 08/25/2011 Document Reviewed: 05/22/2011 [facility] Patient Information 2014 [place], [place].

## 2013-04-20 NOTE — Progress Notes (Signed)
  Subjective:    Patient ID: Keith English, male    DOB: 09/06/1980, 32 y.o.   MRN: [id number]  HPI 32 yo male is here today to follow-up with HTN. He started taking Lisinopril/ HCTZ  04/02/2013. He was having headaches at [facility]. He saw a doctor there and he was put on HTN medication. Since he has started taking the med. He has not had any headaches.He is also here today with nasal drainage and cough and mucus. He is coughing mucus up in the mornings. He has had the sinus pressure and drainage for 2 months. He has tried OTC Mucinex Zyrtec and Claritin. He may have allergys. Has chronic sinus.   Review of Systems     Objective:   Physical Exam  Vitals reviewed. Constitutional: He is oriented to person, place, and time. He appears well-developed and well-nourished. No distress.  HENT:  Head: Normocephalic.  Right Ear: External ear normal.  Left Ear: External ear normal.  Nose: Mucosal edema, rhinorrhea and sinus tenderness present.  Mouth/Throat: Oropharynx is clear and moist.  Eyes: EOM are normal.  Neck: Neck supple. No thyromegaly present.  Cardiovascular: Normal rate, regular rhythm and normal heart sounds.   Pulmonary/Chest: Effort normal and breath sounds normal. No respiratory distress. He has no wheezes. He has no rales. He exhibits no tenderness.  Musculoskeletal: Normal range of motion.  Neurological: He is alert and oriented to person, place, and time. No cranial nerve deficit. He exhibits normal muscle tone. Coordination normal.  Psychiatric: He has a normal mood and affect.     bmet     Assessment & Plan:  HTN near control Dash diet/RF lisinopril/hct Sinusitis/HA/xr if not better

## 2013-04-20 NOTE — Progress Notes (Signed)
  Subjective:    Patient ID: Keith English, male    DOB: 1980-09-14, 32 y.o.   MRN: [id number]  HPI    Review of Systems     Objective:   Physical Exam    Bmet    Assessment & Plan:  HT

## 2013-05-16 ENCOUNTER — Telehealth: Payer: Self-pay

## 2013-05-16 DIAGNOSIS — I10 Essential (primary) hypertension: Secondary | ICD-10-CM

## 2013-05-16 DIAGNOSIS — Z760 Encounter for issue of repeat prescription: Secondary | ICD-10-CM

## 2013-05-16 NOTE — Telephone Encounter (Signed)
Patient needs refill on Lisinopril   [phone]

## 2013-05-17 MED ORDER — LISINOPRIL-HYDROCHLOROTHIAZIDE 20-12.5 MG PO TABS: 1.0000 | ORAL_TABLET | Freq: Every day | ORAL | Status: DC

## 2013-05-17 NOTE — Telephone Encounter (Signed)
Resent. Dr Perrin Maltese sent in previously, for 25yr supply

## 2013-06-22 ENCOUNTER — Ambulatory Visit (INDEPENDENT_AMBULATORY_CARE_PROVIDER_SITE_OTHER): Payer: 59 | Admitting: Family Medicine

## 2013-06-22 VITALS — BP 126/88 | HR 77 | Temp 98.1°F | Resp 18 | Ht 74.0 in | Wt 275.0 lb

## 2013-06-22 DIAGNOSIS — Z113 Encounter for screening for infections with a predominantly sexual mode of transmission: Secondary | ICD-10-CM

## 2013-06-22 DIAGNOSIS — R768 Other specified abnormal immunological findings in serum: Secondary | ICD-10-CM

## 2013-06-22 DIAGNOSIS — J029 Acute pharyngitis, unspecified: Secondary | ICD-10-CM

## 2013-06-22 LAB — HEPATITIS B SURFACE ANTIGEN: Hepatitis B Surface Ag: NEGATIVE

## 2013-06-22 LAB — HEPATITIS B SURFACE ANTIBODY, QUANTITATIVE: HEPATITIS B-POST: 0 m[IU]/mL

## 2013-06-22 LAB — HEPATITIS C ANTIBODY: HCV AB: NEGATIVE

## 2013-06-22 LAB — HIV ANTIBODY (ROUTINE TESTING W REFLEX): HIV: NONREACTIVE

## 2013-06-22 LAB — RPR

## 2013-06-22 LAB — POCT RAPID STREP A (OFFICE): Rapid Strep A Screen: NEGATIVE

## 2013-06-22 MED ORDER — OMEPRAZOLE 20 MG PO CPDR: 20.0000 mg | DELAYED_RELEASE_CAPSULE | Freq: Every day | ORAL | Status: DC
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
## 2013-06-22 NOTE — Patient Instructions (Addendum)
I will be in touch regarding your labs.  Please try taking medication for gastric reflux (excess stomach acid).  I will send in an rx for prilosec for you to try for one month   If your throat is not getting better or if it is getting worse please give me a call

## 2013-06-23 DIAGNOSIS — R7689 Other specified abnormal immunological findings in serum: Secondary | ICD-10-CM | POA: Insufficient documentation

## 2013-06-23 DIAGNOSIS — R768 Other specified abnormal immunological findings in serum: Secondary | ICD-10-CM | POA: Insufficient documentation

## 2013-06-23 LAB — GC/CHLAMYDIA PROBE AMP
CT Probe RNA: NEGATIVE
GC Probe RNA: NEGATIVE

## 2013-06-23 LAB — HSV(HERPES SIMPLEX VRS) I + II AB-IGG: HSV 2 Glycoprotein G Ab, IgG: 8.39 IV — ABNORMAL HIGH

## 2013-10-09 ENCOUNTER — Ambulatory Visit (INDEPENDENT_AMBULATORY_CARE_PROVIDER_SITE_OTHER): Payer: 59 | Admitting: Emergency Medicine

## 2013-10-09 ENCOUNTER — Ambulatory Visit: Payer: 59

## 2013-10-09 VITALS — BP 136/88 | HR 90 | Temp 98.6°F | Resp 18 | Ht 74.0 in | Wt 277.0 lb

## 2013-10-09 DIAGNOSIS — R05 Cough: Secondary | ICD-10-CM

## 2013-10-09 DIAGNOSIS — R079 Chest pain, unspecified: Secondary | ICD-10-CM

## 2013-10-09 DIAGNOSIS — J310 Chronic rhinitis: Secondary | ICD-10-CM

## 2013-10-09 DIAGNOSIS — R059 Cough, unspecified: Secondary | ICD-10-CM

## 2013-10-09 DIAGNOSIS — M549 Dorsalgia, unspecified: Secondary | ICD-10-CM

## 2013-10-09 IMAGING — CR DG LUMBAR SPINE COMPLETE 4+V
5 series · 5 of 5 positions shown · non-contrast
Comparison: None.

CLINICAL DATA: Low back pain.

EXAM:
LUMBAR SPINE - COMPLETE 4+ VIEW

[AP]
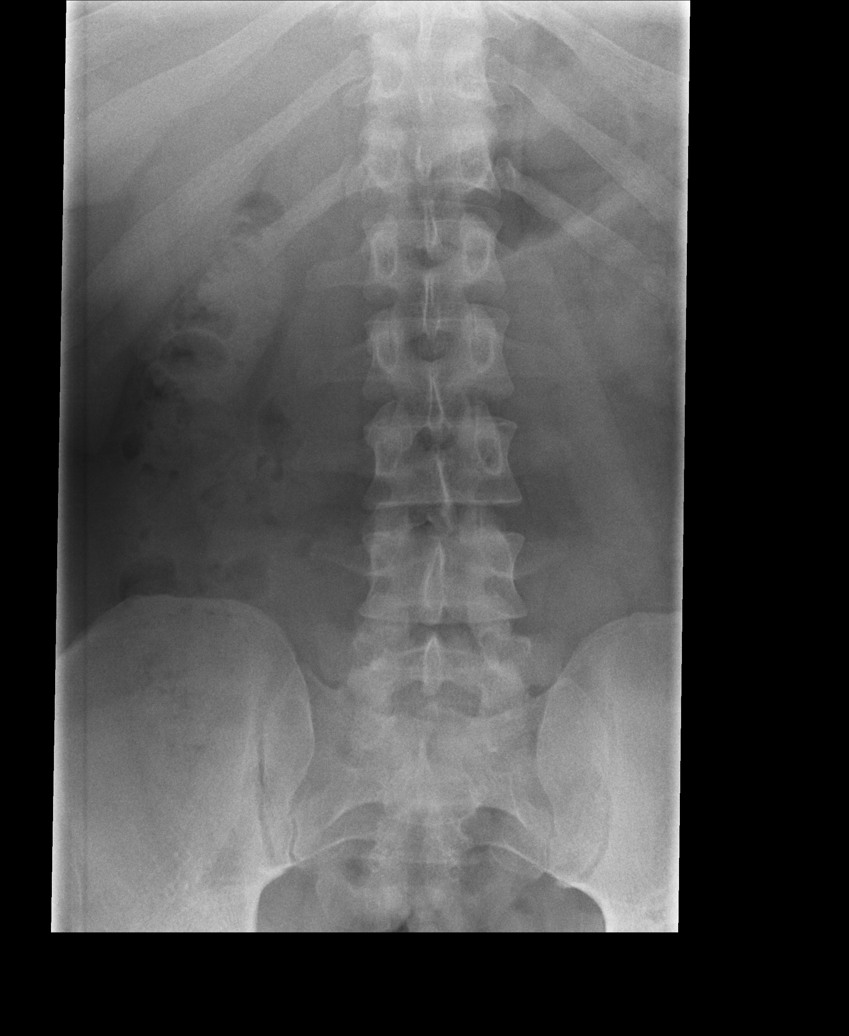

[rpo]
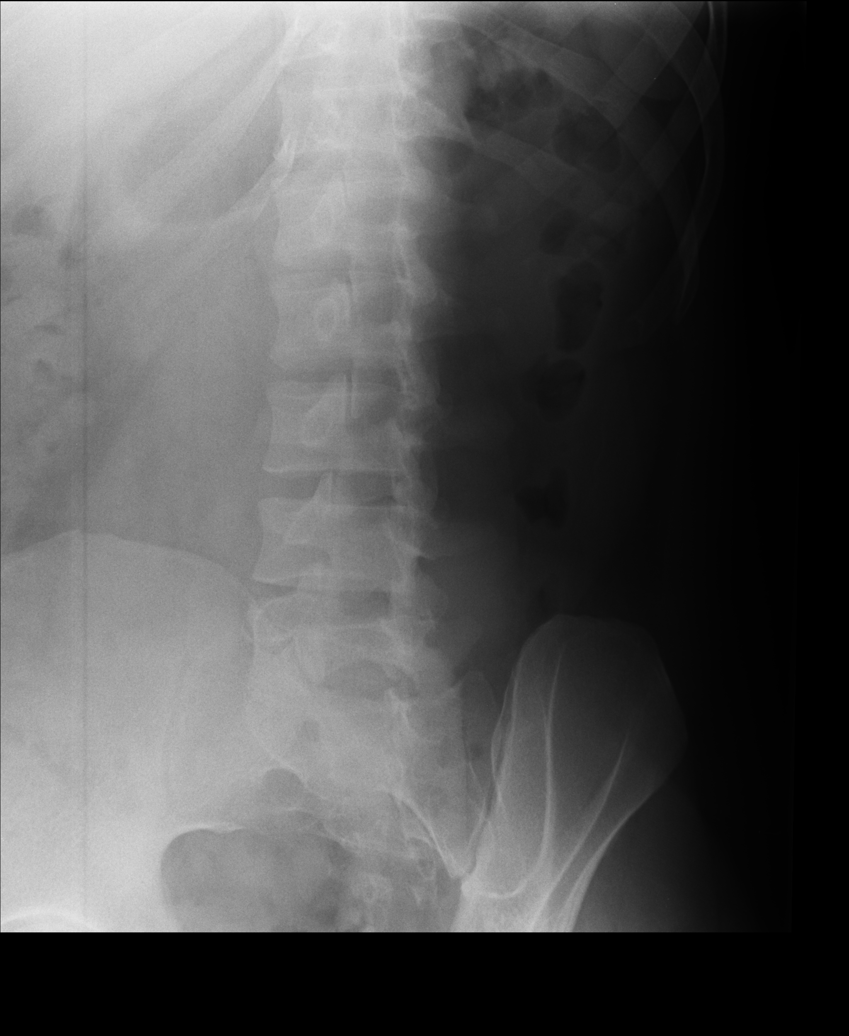

[lpo]
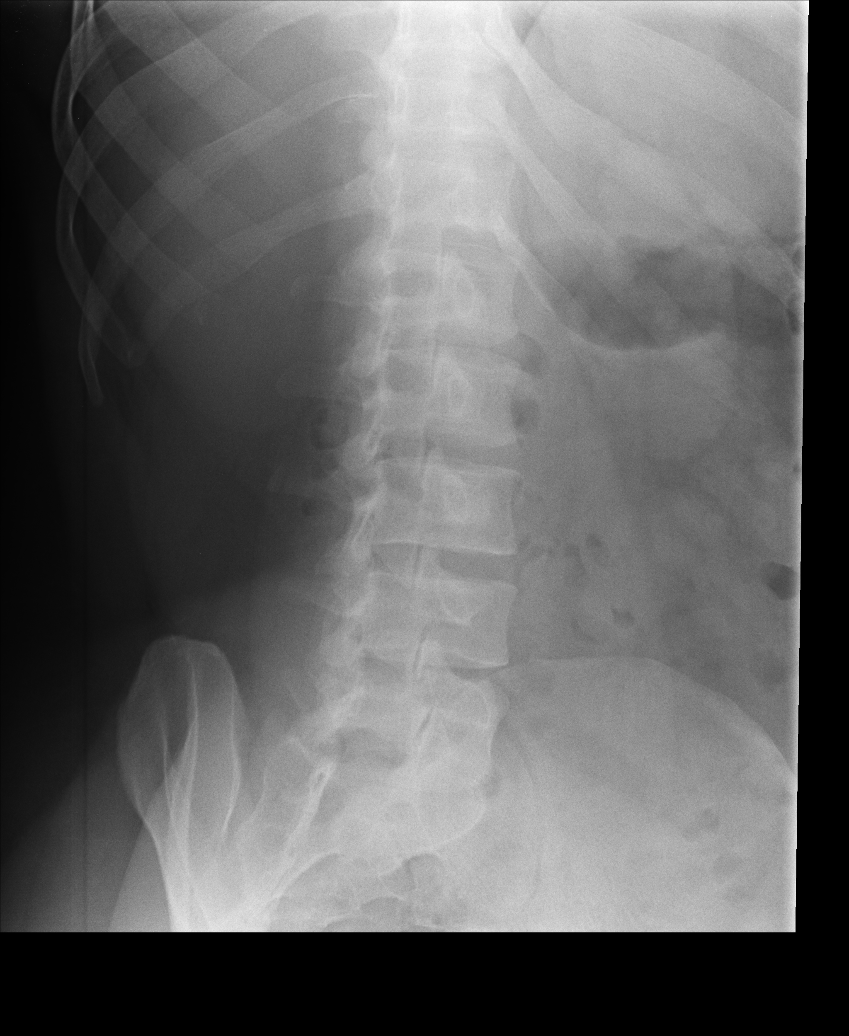

[lateral]
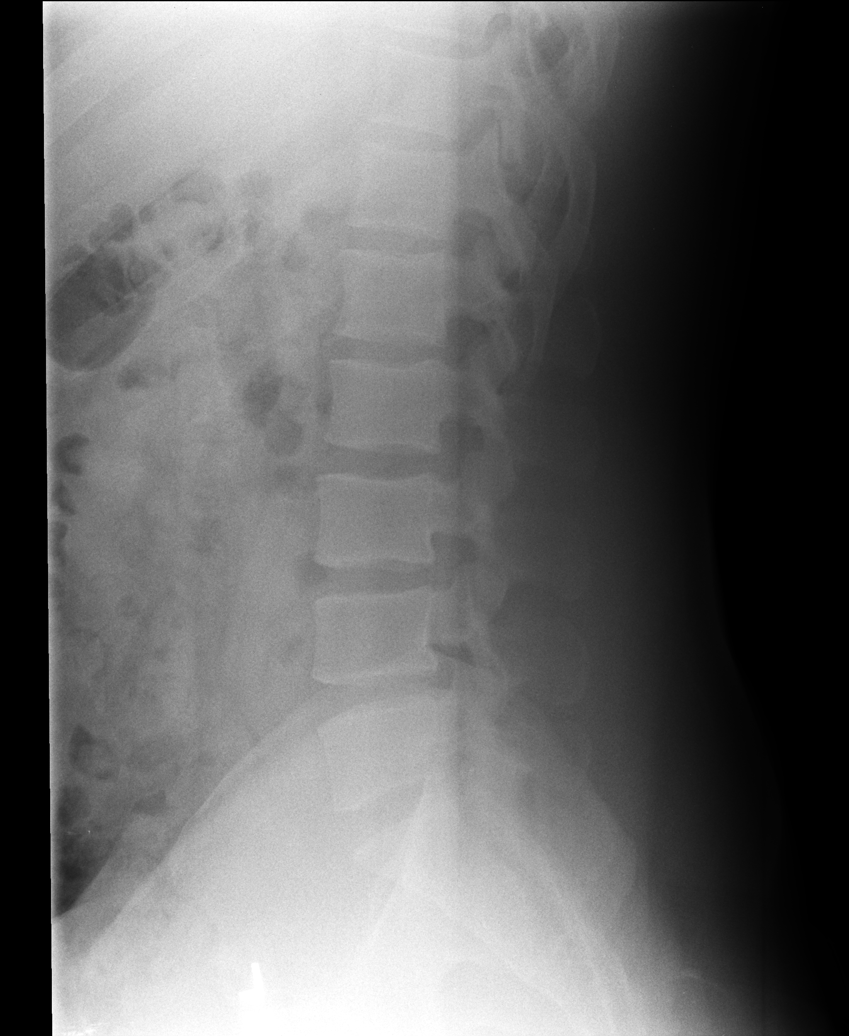

[l5 s1]
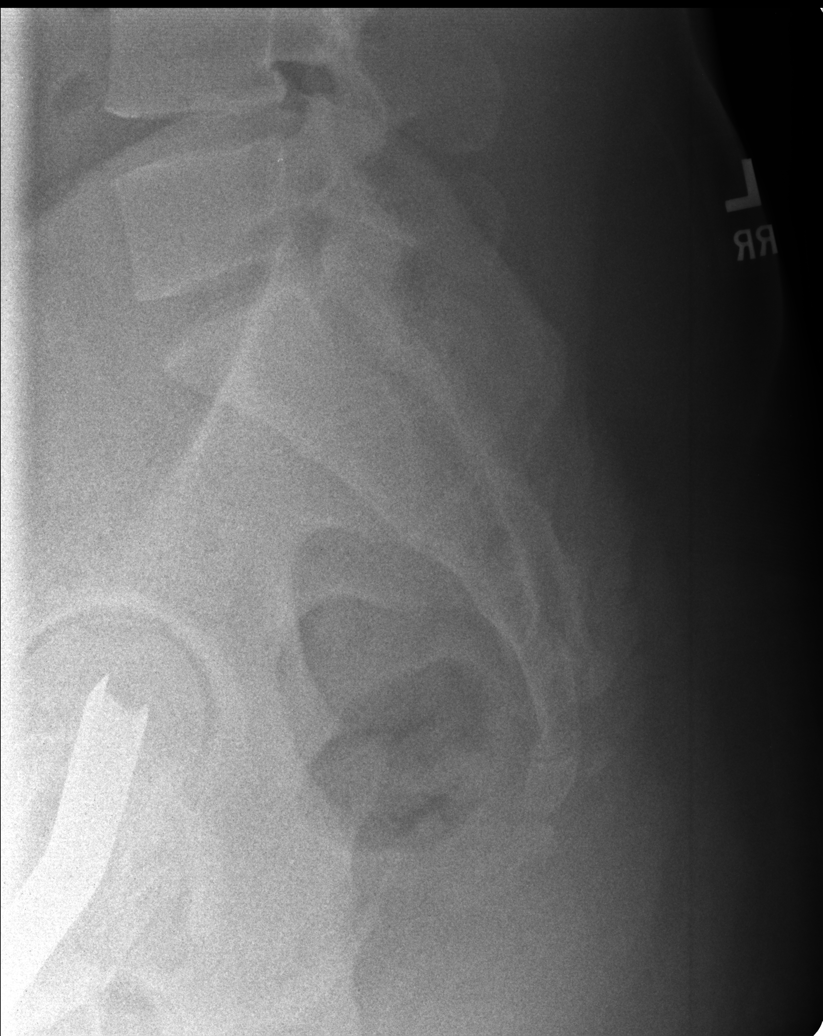

[5 of 5 positions shown; findings below may reference images not displayed]

FINDINGS: Vertebral body height and alignment are maintained. No pars
interarticularis defect is identified. Intervertebral disc space
height is normal. Visualized paraspinal structures demonstrate
partial visualization of fixation hardware in the left hip.
IMPRESSION: Normal-appearing lumbar spine.

## 2013-10-09 MED ORDER — NAPROXEN SODIUM 550 MG PO TABS: 550.0000 mg | ORAL_TABLET | Freq: Two times a day (BID) | ORAL | Status: DC

## 2013-10-09 MED ORDER — CETIRIZINE HCL 10 MG PO TABS: 10.0000 mg | ORAL_TABLET | Freq: Every day | ORAL | Status: DC

## 2013-10-09 MED ORDER — CYCLOBENZAPRINE HCL 5 MG PO TABS: 5.0000 mg | ORAL_TABLET | Freq: Three times a day (TID) | ORAL | Status: DC | PRN

## 2013-10-09 NOTE — Patient Instructions (Signed)
Lumbosacral Strain Lumbosacral strain is a strain of any of the parts that make up your lumbosacral vertebrae. Your lumbosacral vertebrae are the bones that make up the lower third of your backbone. Your lumbosacral vertebrae are held together by muscles and tough, fibrous tissue (ligaments).  CAUSES  A sudden blow to your back can cause lumbosacral strain. Also, anything that causes an excessive stretch of the muscles in the low back can cause this strain. This is typically seen when people exert themselves strenuously, fall, lift heavy objects, bend, or crouch repeatedly. RISK FACTORS  Physically demanding work.  Participation in pushing or pulling sports or sports that require sudden twist of the back (tennis, golf, baseball).  Weight lifting.  Excessive lower back curvature.  Forward-tilted pelvis.  Weak back or abdominal muscles or both.  Tight hamstrings. SIGNS AND SYMPTOMS  Lumbosacral strain may cause pain in the area of your injury or pain that moves (radiates) down your leg.  DIAGNOSIS Your health care provider can often diagnose lumbosacral strain through a physical exam. In some cases, you may need tests such as X-ray exams.  TREATMENT  Treatment for your lower back injury depends on many factors that your clinician will have to evaluate. However, most treatment will include the use of anti-inflammatory medicines. HOME CARE INSTRUCTIONS   Avoid hard physical activities (tennis, racquetball, waterskiing) if you are not in proper physical condition for it. This may aggravate or create problems.  If you have a back problem, avoid sports requiring sudden body movements. Swimming and walking are generally safer activities.  Maintain good posture.  Maintain a healthy weight.  For acute conditions, you may put ice on the injured area.  Put ice in a plastic bag.  Place a towel between your skin and the bag.  Leave the ice on for 20 minutes, 2 3 times a day.  When the  low back starts healing, stretching and strengthening exercises may be recommended. SEEK MEDICAL CARE IF:  Your back pain is getting worse.  You experience severe back pain not relieved with medicines. SEEK IMMEDIATE MEDICAL CARE IF:   You have numbness, tingling, weakness, or problems with the use of your arms or legs.  There is a change in bowel or bladder control.  You have increasing pain in any area of the body, including your belly (abdomen).  You notice shortness of breath, dizziness, or feel faint.  You feel sick to your stomach (nauseous), are throwing up (vomiting), or become sweaty.  You notice discoloration of your toes or legs, or your feet get very cold. MAKE SURE YOU:   Understand these instructions.  Will watch your condition.  Will get help right away if you are not doing well or get worse. Document Released: 03/12/2005 Document Revised: 03/23/2013 Document Reviewed: 01/19/2013 [employer] Patient Information 2014 [employer].  
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
## 2014-02-11 ENCOUNTER — Ambulatory Visit (INDEPENDENT_AMBULATORY_CARE_PROVIDER_SITE_OTHER): Payer: 59 | Admitting: Family Medicine

## 2014-02-11 VITALS — BP 140/90 | HR 81 | Temp 97.6°F | Resp 16 | Ht 74.0 in | Wt 266.0 lb

## 2014-02-11 DIAGNOSIS — R590 Localized enlarged lymph nodes: Secondary | ICD-10-CM

## 2014-02-11 DIAGNOSIS — K219 Gastro-esophageal reflux disease without esophagitis: Secondary | ICD-10-CM

## 2014-02-11 DIAGNOSIS — M546 Pain in thoracic spine: Secondary | ICD-10-CM

## 2014-02-11 DIAGNOSIS — R599 Enlarged lymph nodes, unspecified: Secondary | ICD-10-CM

## 2014-02-11 MED ORDER — PREDNISONE 20 MG PO TABS: 40.0000 mg | ORAL_TABLET | Freq: Every day | ORAL | Status: DC

## 2014-02-11 MED ORDER — PANTOPRAZOLE SODIUM 40 MG PO TBEC: 40.0000 mg | DELAYED_RELEASE_TABLET | Freq: Every day | ORAL | Status: DC
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
## 2014-02-11 NOTE — Patient Instructions (Signed)
Muscle Strain A muscle strain is an injury that occurs when a muscle is stretched beyond its normal length. Usually a small number of muscle fibers are torn when this happens. Muscle strain is rated in degrees. First-degree strains have the least amount of muscle fiber tearing and pain. Second-degree and third-degree strains have increasingly more tearing and pain.  Usually, recovery from muscle strain takes 1-2 weeks. Complete healing takes 5-6 weeks.  CAUSES  Muscle strain happens when a sudden, violent force placed on a muscle stretches it too far. This may occur with lifting, sports, or a fall.  RISK FACTORS Muscle strain is especially common in athletes.  SIGNS AND SYMPTOMS At the site of the muscle strain, there may be:  Pain.  Bruising.  Swelling.  Difficulty using the muscle due to pain or lack of normal function. DIAGNOSIS  Your health care provider will perform a physical exam and ask about your medical history. TREATMENT  Often, the best treatment for a muscle strain is resting, icing, and applying cold compresses to the injured area.  HOME CARE INSTRUCTIONS   Use the PRICE method of treatment to promote muscle healing during the first 2-3 days after your injury. The PRICE method involves:  Protecting the muscle from being injured again.  Restricting your activity and resting the injured body part.  Icing your injury. To do this, put ice in a plastic bag. Place a towel between your skin and the bag. Then, apply the ice and leave it on from 15-20 minutes each hour. After the third day, switch to moist heat packs.  Apply compression to the injured area with a splint or elastic bandage. Be careful not to wrap it too tightly. This may interfere with blood circulation or increase swelling.  Elevate the injured body part above the level of your heart as often as you can.  Only take over-the-counter or prescription medicines for pain, discomfort, or fever as directed by your  health care provider.  Warming up prior to exercise helps to prevent future muscle strains. SEEK MEDICAL CARE IF:   You have increasing pain or swelling in the injured area.  You have numbness, tingling, or a significant loss of strength in the injured area. MAKE SURE YOU:   Understand these instructions.  Will watch your condition.  Will get help right away if you are not doing well or get worse. Document Released: 06/02/2005 Document Revised: 03/23/2013 Document Reviewed: 12/30/2012 [facility] Patient Information 2015 [place], [place]. This information is not intended to replace advice given to you by your health care provider. Make sure you discuss any questions you have with your health care provider. Gastroesophageal Reflux Disease, Adult Gastroesophageal reflux disease (GERD) happens when acid from your stomach flows up into the esophagus. When acid comes in contact with the esophagus, the acid causes soreness (inflammation) in the esophagus. Over time, GERD may create small holes (ulcers) in the lining of the esophagus. CAUSES   Increased body weight. This puts pressure on the stomach, making acid rise from the stomach into the esophagus.  Smoking. This increases acid production in the stomach.  Drinking alcohol. This causes decreased pressure in the lower esophageal sphincter (valve or ring of muscle between the esophagus and stomach), allowing acid from the stomach into the esophagus.  Late evening meals and a full stomach. This increases pressure and acid production in the stomach.  A malformed lower esophageal sphincter. Sometimes, no cause is found. SYMPTOMS   Burning pain in the lower part  of the mid-chest behind the breastbone and in the mid-stomach area. This may occur twice a week or more often.  Trouble swallowing.  Sore throat.  Dry cough.  Asthma-like symptoms including chest tightness, shortness of breath, or wheezing. DIAGNOSIS  Your caregiver may be able  to diagnose GERD based on your symptoms. In some cases, X-rays and other tests may be done to check for complications or to check the condition of your stomach and esophagus. TREATMENT  Your caregiver may recommend over-the-counter or prescription medicines to help decrease acid production. Ask your caregiver before starting or adding any new medicines.  HOME CARE INSTRUCTIONS   Change the factors that you can control. Ask your caregiver for guidance concerning weight loss, quitting smoking, and alcohol consumption.  Avoid foods and drinks that make your symptoms worse, such as:  Caffeine or alcoholic drinks.  Chocolate.  Peppermint or mint flavorings.  Garlic and onions.  Spicy foods.  Citrus fruits, such as oranges, lemons, or limes.  Tomato-based foods such as sauce, chili, salsa, and pizza.  Fried and fatty foods.  Avoid lying down for the 3 hours prior to your bedtime or prior to taking a nap.  Eat small, frequent meals instead of large meals.  Wear loose-fitting clothing. Do not wear anything tight around your waist that causes pressure on your stomach.  Raise the head of your bed 6 to 8 inches with wood blocks to help you sleep. Extra pillows will not help.  Only take over-the-counter or prescription medicines for pain, discomfort, or fever as directed by your caregiver.  Do not take aspirin, ibuprofen, or other nonsteroidal anti-inflammatory drugs (NSAIDs). SEEK IMMEDIATE MEDICAL CARE IF:   You have pain in your arms, neck, jaw, teeth, or back.  Your pain increases or changes in intensity or duration.  You develop nausea, vomiting, or sweating (diaphoresis).  You develop shortness of breath, or you faint.  Your vomit is green, yellow, black, or looks like coffee grounds or blood.  Your stool is red, bloody, or black. These symptoms could be signs of other problems, such as heart disease, gastric bleeding, or esophageal bleeding. MAKE SURE YOU:   Understand  these instructions.  Will watch your condition.  Will get help right away if you are not doing well or get worse. Document Released: 03/12/2005 Document Revised: 08/25/2011 Document Reviewed: 12/20/2010 [facility] Patient Information 2015 [place], [place]. This information is not intended to replace advice given to you by your health care provider. Make sure you discuss any questions you have with your health care provider.

## 2014-02-16 ENCOUNTER — Telehealth: Payer: Self-pay

## 2014-02-16 DIAGNOSIS — IMO0001 Reserved for inherently not codable concepts without codable children: Secondary | ICD-10-CM

## 2014-02-16 MED ORDER — METHOCARBAMOL 500 MG PO TABS: 500.0000 mg | ORAL_TABLET | Freq: Three times a day (TID) | ORAL | Status: DC | PRN

## 2014-02-16 NOTE — Telephone Encounter (Signed)
Pt saw Dr. Elbert Ewings for Back spasms on 8/29, and was told to call back if his symptoms did not improve. Pt states that they have actually gotten worse, and he described the pain as "throbbing". He said that if Dr.L had anything else in mind to prescribe for him, he would be willing to try it. Please advise pt.

## 2014-02-16 NOTE — Telephone Encounter (Signed)
Pt is asking if he can try a different muscle relaxer to help with the pain. He has been taking the prednisone and using OTC naproxen but it is still causing a lot of pain. Please advise. The Flexeril did not work well for him.

## 2014-06-26 ENCOUNTER — Other Ambulatory Visit: Payer: Self-pay | Admitting: Internal Medicine

## 2014-07-14 ENCOUNTER — Ambulatory Visit (INDEPENDENT_AMBULATORY_CARE_PROVIDER_SITE_OTHER): Payer: No Typology Code available for payment source | Admitting: Family Medicine

## 2014-07-14 ENCOUNTER — Ambulatory Visit (INDEPENDENT_AMBULATORY_CARE_PROVIDER_SITE_OTHER): Payer: No Typology Code available for payment source

## 2014-07-14 VITALS — BP 128/90 | HR 81 | Temp 98.0°F | Resp 18 | Wt 261.0 lb

## 2014-07-14 DIAGNOSIS — M62838 Other muscle spasm: Secondary | ICD-10-CM

## 2014-07-14 DIAGNOSIS — M542 Cervicalgia: Secondary | ICD-10-CM

## 2014-07-14 DIAGNOSIS — M6248 Contracture of muscle, other site: Secondary | ICD-10-CM | POA: Diagnosis not present

## 2014-07-14 DIAGNOSIS — M25519 Pain in unspecified shoulder: Secondary | ICD-10-CM

## 2014-07-14 DIAGNOSIS — G542 Cervical root disorders, not elsewhere classified: Secondary | ICD-10-CM

## 2014-07-14 IMAGING — CR DG CERVICAL SPINE 2 OR 3 VIEWS
4 series · 4 of 4 positions shown · non-contrast
Comparison: None.

CLINICAL DATA: Chronic neck pain. No recent injuries. Trapezius
muscle spasm.

EXAM:
CERVICAL SPINE - 2-3 VIEW

[lateral]
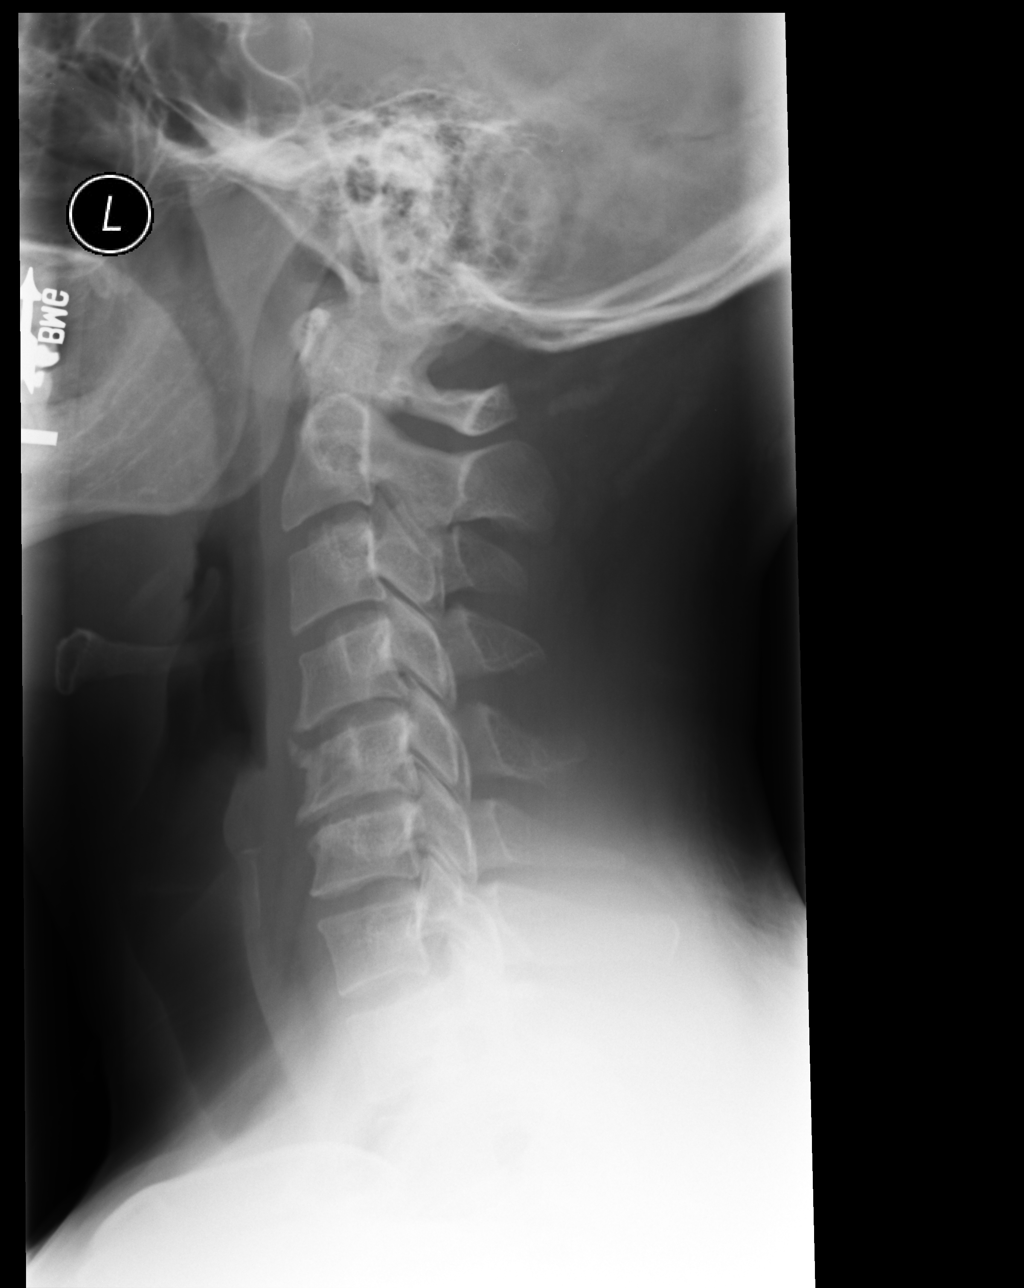

[ap open mouth]
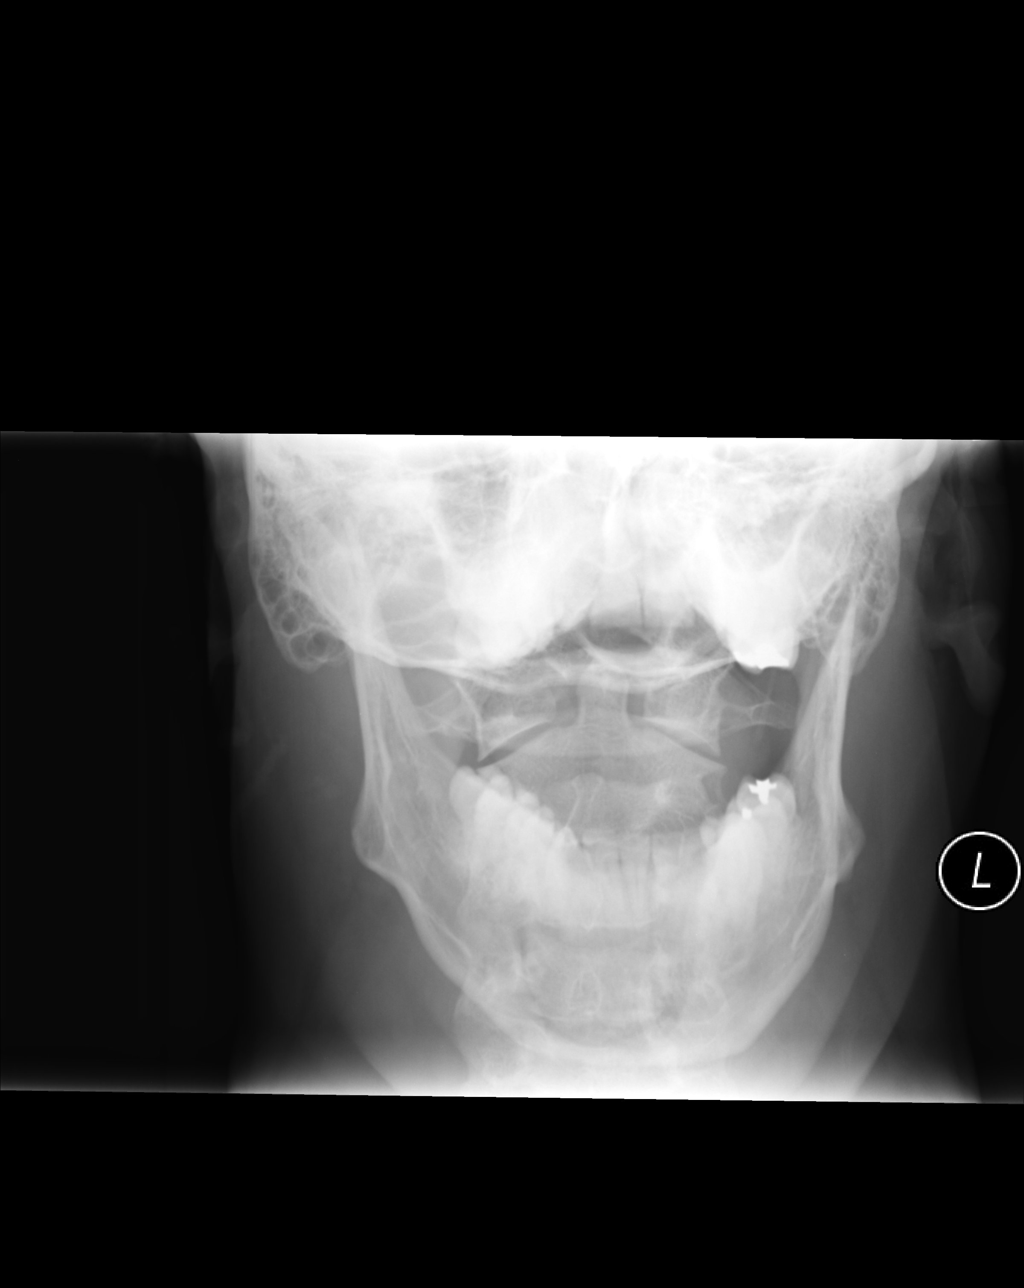

[AP]
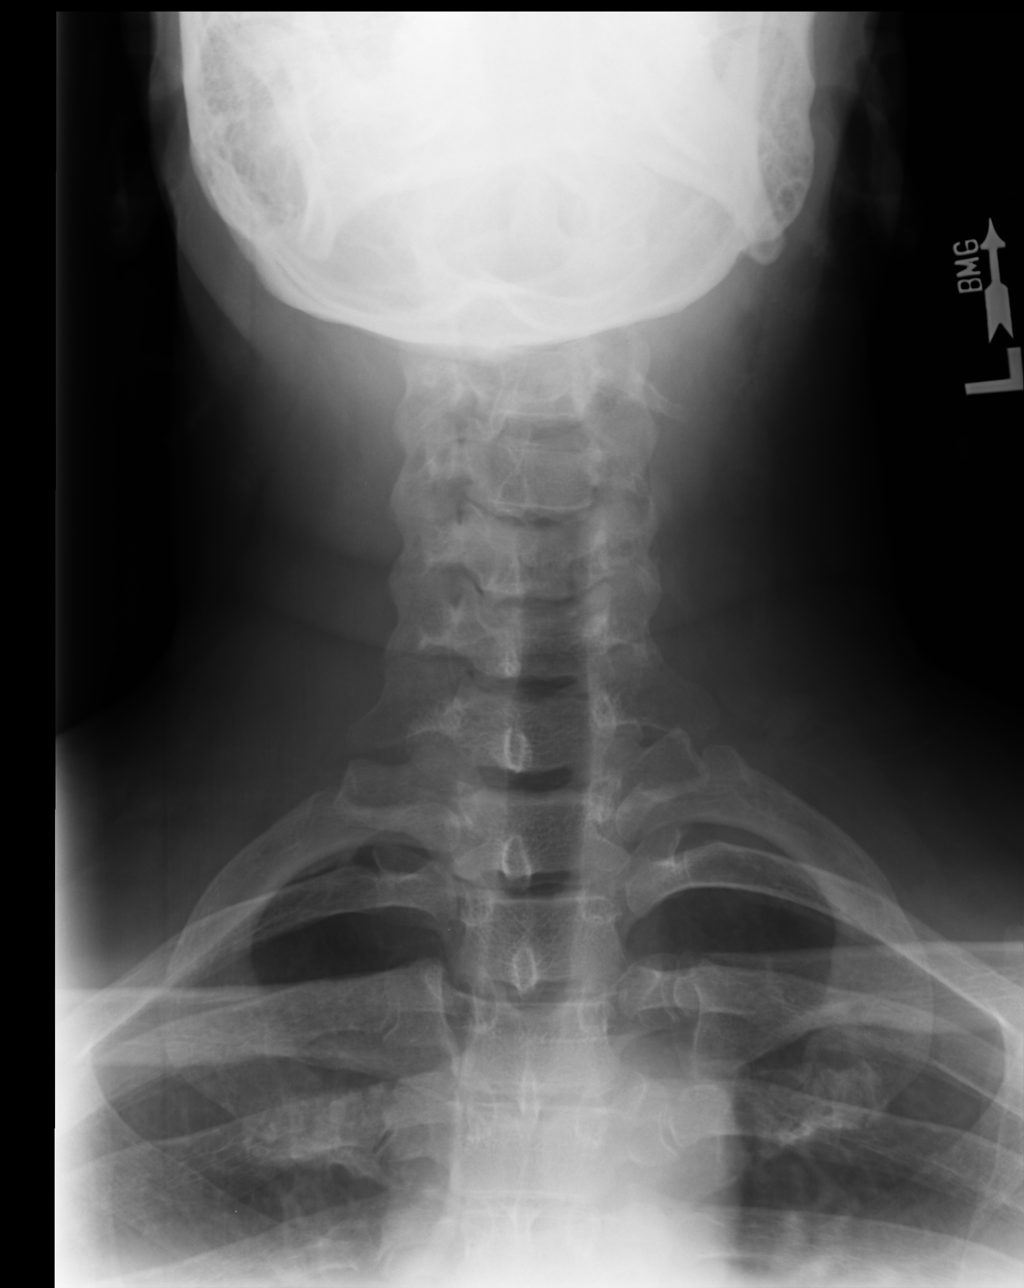

[swimmers]
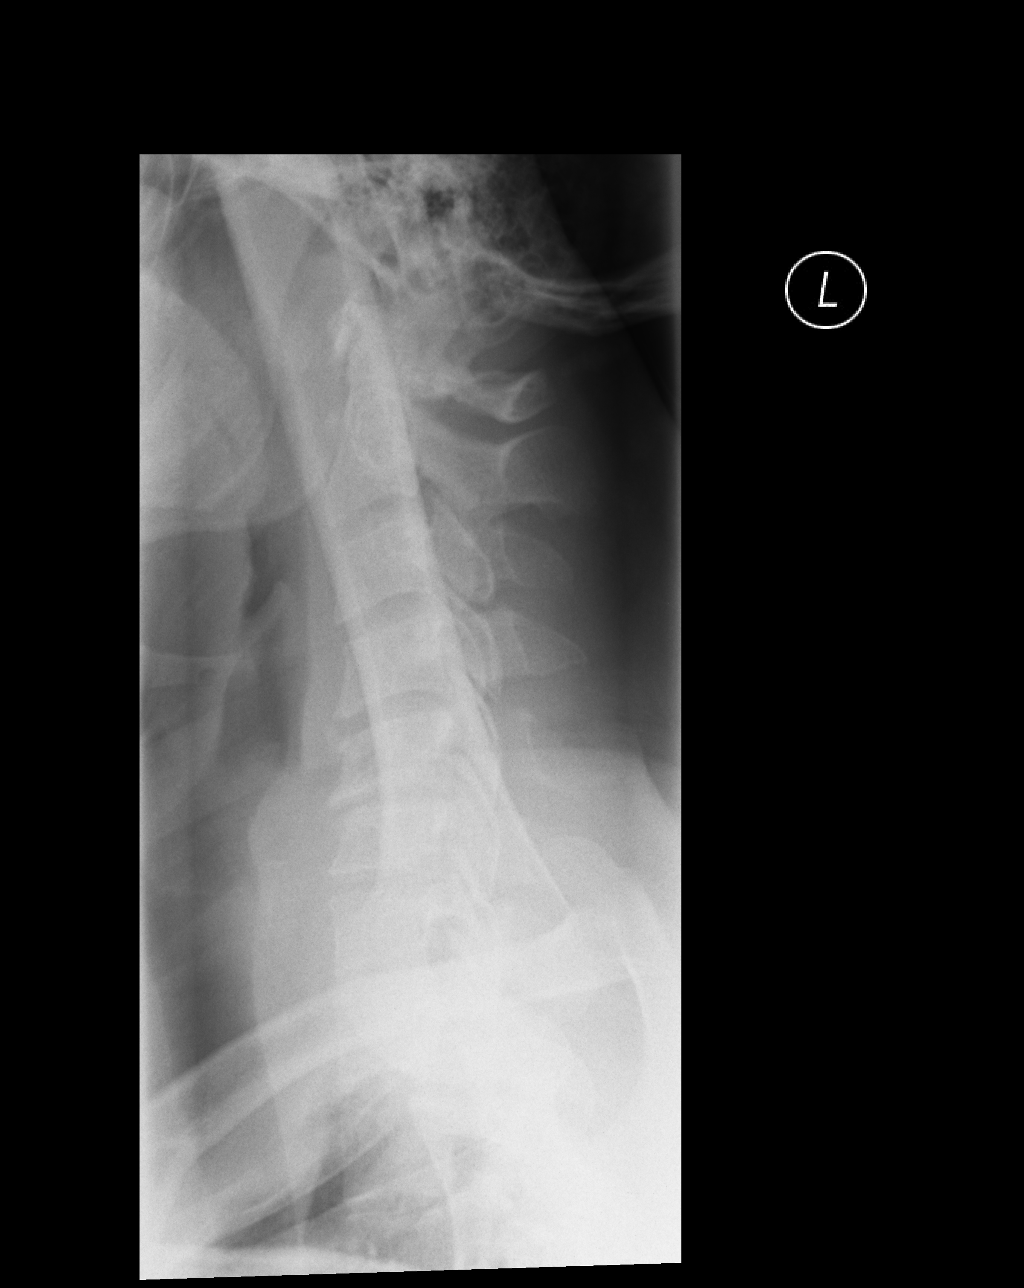

[4 of 4 positions shown; findings below may reference images not displayed]

FINDINGS: Straightening of the usual cervical lordosis. Anatomic posterior
alignment. No visible fractures. Moderate disc space narrowing at
C5-6. Mild disc space narrowing at C4-5 and C6-7. Bridging anterior
osteophytes at C4-5. Normal prevertebral soft tissues. No static
signs of instability.
IMPRESSION: 1. Straightening of the usual lordosis which may reflect positioning
and/or spasm.
2. Moderate degenerative disc disease at C5-6 and mild degenerative
disc disease at C4-5 and C6-7.

## 2014-07-14 MED ORDER — PREDNISONE 20 MG PO TABS
ORAL_TABLET | ORAL | Status: DC
Start: 2014-07-14 — End: 2014-07-14

## 2014-07-14 MED ORDER — ACETAMINOPHEN 500 MG PO TABS: 1000.0000 mg | ORAL_TABLET | Freq: Three times a day (TID) | ORAL | Status: DC | PRN

## 2014-07-14 MED ORDER — CYCLOBENZAPRINE HCL 10 MG PO TABS: 10.0000 mg | ORAL_TABLET | Freq: Three times a day (TID) | ORAL | Status: DC | PRN

## 2014-07-14 MED ORDER — CYCLOBENZAPRINE HCL 10 MG PO TABS: 10.0000 mg | ORAL_TABLET | Freq: Three times a day (TID) | ORAL | Status: AC | PRN

## 2014-07-14 MED ORDER — PREDNISONE 20 MG PO TABS: ORAL_TABLET | ORAL | Status: DC

## 2014-07-14 NOTE — Patient Instructions (Signed)
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
## 2014-08-28 ENCOUNTER — Ambulatory Visit (INDEPENDENT_AMBULATORY_CARE_PROVIDER_SITE_OTHER): Payer: Self-pay | Admitting: Family Medicine

## 2014-08-28 ENCOUNTER — Encounter: Payer: Self-pay | Admitting: Family Medicine

## 2014-08-28 VITALS — BP 128/82 | HR 98 | Temp 98.0°F | Resp 20 | Ht 75.0 in | Wt 278.0 lb

## 2014-08-28 DIAGNOSIS — I10 Essential (primary) hypertension: Secondary | ICD-10-CM

## 2014-08-28 DIAGNOSIS — Z029 Encounter for administrative examinations, unspecified: Secondary | ICD-10-CM

## 2014-08-28 DIAGNOSIS — Z024 Encounter for examination for driving license: Secondary | ICD-10-CM

## 2014-08-28 NOTE — Patient Instructions (Signed)
One year DOT card  Recommend weight loss and regular exercise  Return as needed
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
## 2015-03-14 ENCOUNTER — Other Ambulatory Visit: Payer: Self-pay | Admitting: Physician Assistant

## 2015-03-30 ENCOUNTER — Ambulatory Visit (INDEPENDENT_AMBULATORY_CARE_PROVIDER_SITE_OTHER): Payer: BLUE CROSS/BLUE SHIELD | Admitting: Family Medicine

## 2015-03-30 VITALS — BP 136/98 | HR 73 | Temp 98.1°F | Resp 16 | Ht 75.0 in | Wt 265.0 lb

## 2015-03-30 DIAGNOSIS — I10 Essential (primary) hypertension: Secondary | ICD-10-CM

## 2015-03-30 DIAGNOSIS — Z113 Encounter for screening for infections with a predominantly sexual mode of transmission: Secondary | ICD-10-CM | POA: Diagnosis not present

## 2015-03-30 DIAGNOSIS — M5431 Sciatica, right side: Secondary | ICD-10-CM | POA: Diagnosis not present

## 2015-03-30 MED ORDER — AMLODIPINE BESYLATE 5 MG PO TABS
5.0000 mg | ORAL_TABLET | Freq: Every day | ORAL | Status: DC
Start: 2015-03-30 — End: 2015-09-25

## 2015-03-30 MED ORDER — DICLOFENAC SODIUM 75 MG PO TBEC: 75.0000 mg | DELAYED_RELEASE_TABLET | Freq: Two times a day (BID) | ORAL | Status: DC

## 2015-03-30 NOTE — Progress Notes (Signed)
   Subjective:    Patient ID: Keith English, male    DOB: 04/09/1981, 34 y.o.   MRN: [id number] This chart was scribed for Elvina SidleKurt Olympia Adelsberger, MD by Littie Deedsichard Sun, Medical Scribe. This patient was seen in Room 8 and the patient's care was started at 10:57 AM.   HPI HPI Comments: Keith English is a 34 y.o. male with a history of HSV-2 seropositive and hypertension who presents to the [facility] for STD screening. Patient denies having any symptoms currently.  Patient also reports having lower right-sided back pain radiating down his right leg. The pain is worse with certain movements. He has had some difficulty sleeping due to the pain, which sometimes wakes him up from sleep. He has also had some neck pain. He recently saw Dr. Yetta BarreJones, neurosurgeon, and was told he had a pinched nerve in his C-spine. He was started on Medrol and has recently noticed pain in his right groin. The back pain is worse than the neck pain.  Patient is on lisinopril-HCTZ; he does not think it has been controlling his blood pressure. He has had urinary frequency with the lisinopril-HCTZ.  Patient used to work for the [employer] [place], but now works at a trucking company.  Review of Systems  Genitourinary: Positive for frequency.  Musculoskeletal: Positive for back pain and neck pain.       Objective:   Physical Exam CONSTITUTIONAL: Well developed/well nourished HEAD: Normocephalic/atraumatic EYES: EOM/PERRL ENMT: Mucous membranes moist NECK: supple no meningeal signs SPINE: entire spine nontender CV: S1/S2 noted, no murmurs/rubs/gallops noted LUNGS: Lungs are clear to auscultation bilaterally, no apparent distress ABDOMEN: soft, nontender, no rebound or guarding GU: no cva tenderness NEURO: Pt is awake/alert, moves all extremitiesx4 EXTREMITIES: pulses normal, full ROM SKIN: warm, color normal PSYCH: no abnormalities of mood noted      Assessment & Plan:   By signing my name  below, I, Littie Deedsichard Sun, attest that this documentation has been prepared under the direction and in the presence of Elvina SidleKurt Con Arganbright, MD.  Electronically Signed: Littie Deedsichard Sun, Medical Scribe. 03/30/2015. 10:57 AM.  This chart was scribed in my presence and reviewed by me personally.    ICD-9-CM ICD-10-CM   1. Screen for STD (sexually transmitted disease) V74.5 Z11.3 GC/Chlamydia Probe Amp     HIV antibody     RPR  2. Right sciatic nerve pain 724.3 M54.31 diclofenac (VOLTAREN) 75 MG EC tablet  3. Essential hypertension 401.9 I10 amLODipine (NORVASC) 5 MG tablet     Signed, Elvina SidleKurt Janeliz Prestwood, MD

## 2015-03-31 LAB — RPR

## 2015-03-31 LAB — HIV ANTIBODY (ROUTINE TESTING W REFLEX): HIV 1&2 Ab, 4th Generation: NONREACTIVE

## 2015-03-31 LAB — GC/CHLAMYDIA PROBE AMP
CT Probe RNA: NEGATIVE
GC Probe RNA: NEGATIVE

## 2015-09-03 ENCOUNTER — Ambulatory Visit: Payer: Self-pay | Admitting: Emergency Medicine

## 2015-09-03 ENCOUNTER — Telehealth: Payer: Self-pay | Admitting: *Deleted

## 2015-09-03 ENCOUNTER — Ambulatory Visit (INDEPENDENT_AMBULATORY_CARE_PROVIDER_SITE_OTHER): Payer: BLUE CROSS/BLUE SHIELD | Admitting: Emergency Medicine

## 2015-09-03 VITALS — BP 138/86 | HR 89 | Temp 98.2°F | Resp 17 | Ht 75.0 in | Wt 275.0 lb

## 2015-09-03 DIAGNOSIS — Z113 Encounter for screening for infections with a predominantly sexual mode of transmission: Secondary | ICD-10-CM | POA: Diagnosis not present

## 2015-09-03 DIAGNOSIS — Z024 Encounter for examination for driving license: Secondary | ICD-10-CM

## 2015-09-03 DIAGNOSIS — Z021 Encounter for pre-employment examination: Secondary | ICD-10-CM

## 2015-09-03 NOTE — Telephone Encounter (Signed)
Dr. Cleta Albertsaub,  The DOT exam on this patient needs to be documented in another encounter. Pt has two encounters for today. He has a DOT encounter that says DOT in the notes section. We need to transfer his DOT exam information to that encounter.  Sorry for the confusion.
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
## 2015-09-04 ENCOUNTER — Telehealth: Payer: Self-pay | Admitting: Family Medicine

## 2015-09-04 LAB — GC/CHLAMYDIA PROBE AMP
CT Probe RNA: NOT DETECTED
GC Probe RNA: NOT DETECTED

## 2015-09-04 NOTE — Telephone Encounter (Signed)
Patient declined flu shot  °

## 2015-09-18 ENCOUNTER — Telehealth: Payer: Self-pay

## 2015-09-18 NOTE — Telephone Encounter (Signed)
Patient needs to return to clinic so he can have a good thorough evaluation and see why he is still having symptoms.

## 2015-09-18 NOTE — Telephone Encounter (Signed)
Pt states he tested negative for STD but is still having some discomfort and would like something called in   Best number [phone]

## 2015-09-18 NOTE — Telephone Encounter (Signed)
Please advise 

## 2015-09-19 NOTE — Telephone Encounter (Signed)
Pt advised.

## 2015-09-25 ENCOUNTER — Ambulatory Visit (INDEPENDENT_AMBULATORY_CARE_PROVIDER_SITE_OTHER): Payer: BLUE CROSS/BLUE SHIELD | Admitting: Emergency Medicine

## 2015-09-25 ENCOUNTER — Ambulatory Visit (INDEPENDENT_AMBULATORY_CARE_PROVIDER_SITE_OTHER): Payer: BLUE CROSS/BLUE SHIELD

## 2015-09-25 VITALS — BP 162/98 | HR 72 | Temp 98.1°F | Resp 18 | Ht 75.0 in | Wt 276.0 lb

## 2015-09-25 DIAGNOSIS — I10 Essential (primary) hypertension: Secondary | ICD-10-CM

## 2015-09-25 DIAGNOSIS — Z113 Encounter for screening for infections with a predominantly sexual mode of transmission: Secondary | ICD-10-CM

## 2015-09-25 DIAGNOSIS — R300 Dysuria: Secondary | ICD-10-CM

## 2015-09-25 DIAGNOSIS — B009 Herpesviral infection, unspecified: Secondary | ICD-10-CM

## 2015-09-25 LAB — POC MICROSCOPIC URINALYSIS (UMFC): MUCUS RE: ABSENT

## 2015-09-25 LAB — HIV ANTIBODY (ROUTINE TESTING W REFLEX): HIV 1&2 Ab, 4th Generation: NONREACTIVE

## 2015-09-25 LAB — POCT URINALYSIS DIP (MANUAL ENTRY)
Bilirubin, UA: NEGATIVE
Blood, UA: NEGATIVE
Glucose, UA: NEGATIVE
Ketones, POC UA: NEGATIVE
LEUKOCYTES UA: NEGATIVE
NITRITE UA: NEGATIVE
PH UA: 6.5
Spec Grav, UA: 1.02
UROBILINOGEN UA: 0.2

## 2015-09-25 LAB — HEPATITIS C ANTIBODY: HCV Ab: NEGATIVE

## 2015-09-25 MED ORDER — AMLODIPINE BESYLATE 10 MG PO TABS: ORAL_TABLET | ORAL | Status: DC

## 2015-09-25 MED ORDER — VALACYCLOVIR HCL 1 G PO TABS: ORAL_TABLET | ORAL | Status: DC

## 2015-09-25 NOTE — Patient Instructions (Signed)
     IF you received an x-ray today, you will receive an invoice from [facility]. Please contact [facility] at [phone] with questions or concerns regarding your invoice.   IF you received labwork today, you will receive an invoice from [facility]. Please contact [facility] at [phone] with questions or concerns regarding your invoice.   Our billing staff will not be able to assist you with questions regarding bills from these companies.  You will be contacted with the lab results as soon as they are available. The fastest way to get your results is to activate your My Chart account. Instructions are located on the last page of this paperwork. If you have not heard from us regarding the results in 2 weeks, please contact this office.      
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
## 2015-09-26 LAB — URINE CULTURE
Colony Count: NO GROWTH
ORGANISM ID, BACTERIA: NO GROWTH

## 2015-09-26 LAB — GC/CHLAMYDIA PROBE AMP
CT Probe RNA: NOT DETECTED
GC PROBE AMP APTIMA: NOT DETECTED

## 2015-09-26 LAB — RPR

## 2015-09-27 LAB — HERPES SIMPLEX VIRUS CULTURE: Organism ID, Bacteria: NOT DETECTED

## 2015-12-24 DIAGNOSIS — N419 Inflammatory disease of prostate, unspecified: Secondary | ICD-10-CM | POA: Diagnosis not present

## 2016-04-22 DIAGNOSIS — N419 Inflammatory disease of prostate, unspecified: Secondary | ICD-10-CM | POA: Diagnosis not present

## 2016-08-26 ENCOUNTER — Ambulatory Visit (INDEPENDENT_AMBULATORY_CARE_PROVIDER_SITE_OTHER): Payer: BLUE CROSS/BLUE SHIELD | Admitting: Physician Assistant

## 2016-08-26 VITALS — BP 158/94 | HR 96 | Temp 98.0°F | Resp 16 | Ht 75.0 in | Wt 291.0 lb

## 2016-08-26 DIAGNOSIS — I10 Essential (primary) hypertension: Secondary | ICD-10-CM | POA: Diagnosis not present

## 2016-08-26 MED ORDER — AMLODIPINE BESYLATE 10 MG PO TABS: ORAL_TABLET | ORAL | 1 refills | Status: DC

## 2016-08-26 MED ORDER — LISINOPRIL 10 MG PO TABS: 10.0000 mg | ORAL_TABLET | Freq: Every day | ORAL | 0 refills | Status: DC

## 2016-08-26 NOTE — Patient Instructions (Addendum)
Continue taking amlodipine 10mg  daily. Also start lisinopril 10mg  daily. We will follow up in one week for bp recheck, we will more than likely have to increase bp med at that time.    In the meantime, start trying to cut your salt intake in half. Read the information below to help with diet. Also try to take your bp outside of the office a few times on your own. The goal is <140/90.     DASH Eating Plan DASH stands for "Dietary Approaches to Stop Hypertension." The DASH eating plan is a healthy eating plan that has been shown to reduce high blood pressure (hypertension). It may also reduce your risk for type 2 diabetes, heart disease, and stroke. The DASH eating plan may also help with weight loss. What are tips for following this plan? General guidelines   Avoid eating more than 2,300 mg (milligrams) of salt (sodium) a day. If you have hypertension, you may need to reduce your sodium intake to 1,500 mg a day.  Limit alcohol intake to no more than 1 drink a day for nonpregnant women and 2 drinks a day for men. One drink equals 12 oz of beer, 5 oz of wine, or 1 oz of hard liquor.  Work with your health care provider to maintain a healthy body weight or to lose weight. Ask what an ideal weight is for you.  Get at least 30 minutes of exercise that causes your heart to beat faster (aerobic exercise) most days of the week. Activities may include walking, swimming, or biking.  Work with your health care provider or diet and nutrition specialist (dietitian) to adjust your eating plan to your individual calorie needs. Reading food labels   Check food labels for the amount of sodium per serving. Choose foods with less than 5 percent of the Daily Value of sodium. Generally, foods with less than 300 mg of sodium per serving fit into this eating plan.  To find whole grains, look for the word "whole" as the first word in the ingredient list. Shopping   Buy products labeled as "low-sodium" or "no  salt added."  Buy fresh foods. Avoid canned foods and premade or frozen meals. Cooking   Avoid adding salt when cooking. Use salt-free seasonings or herbs instead of table salt or sea salt. Check with your health care provider or pharmacist before using salt substitutes.  Do not fry foods. Cook foods using healthy methods such as baking, boiling, grilling, and broiling instead.  Cook with heart-healthy oils, such as olive, canola, soybean, or sunflower oil. Meal planning    Eat a balanced diet that includes:  5 or more servings of fruits and vegetables each day. At each meal, try to fill half of your plate with fruits and vegetables.  Up to 6-8 servings of whole grains each day.  Less than 6 oz of lean meat, poultry, or fish each day. A 3-oz serving of meat is about the same size as a deck of cards. One egg equals 1 oz.  2 servings of low-fat dairy each day.  A serving of nuts, seeds, or beans 5 times each week.  Heart-healthy fats. Healthy fats called Omega-3 fatty acids are found in foods such as flaxseeds and coldwater fish, like sardines, salmon, and mackerel.  Limit how much you eat of the following:  Canned or prepackaged foods.  Food that is high in trans fat, such as fried foods.  Food that is high in saturated fat, such as fatty  meat.  Sweets, desserts, sugary drinks, and other foods with added sugar.  Full-fat dairy products.  Do not salt foods before eating.  Try to eat at least 2 vegetarian meals each week.  Eat more home-cooked food and less restaurant, buffet, and fast food.  When eating at a restaurant, ask that your food be prepared with less salt or no salt, if possible. What foods are recommended? The items listed may not be a complete list. Talk with your dietitian about what dietary choices are best for you. Grains  Whole-grain or whole-wheat bread. Whole-grain or whole-wheat pasta. Brown rice. Orpah Cobb. Bulgur. Whole-grain and low-sodium  cereals. Pita bread. Low-fat, low-sodium crackers. Whole-wheat flour tortillas. Vegetables  Fresh or frozen vegetables (raw, steamed, roasted, or grilled). Low-sodium or reduced-sodium tomato and vegetable juice. Low-sodium or reduced-sodium tomato sauce and tomato paste. Low-sodium or reduced-sodium canned vegetables. Fruits  All fresh, dried, or frozen fruit. Canned fruit in natural juice (without added sugar). Meat and other protein foods  Skinless chicken or [place]. Ground chicken or [place]. Pork with fat trimmed off. Fish and seafood. Egg whites. Dried beans, peas, or lentils. Unsalted nuts, nut butters, and seeds. Unsalted canned beans. Lean cuts of beef with fat trimmed off. Low-sodium, lean deli meat. Dairy  Low-fat (1%) or fat-free (skim) milk. Fat-free, low-fat, or reduced-fat cheeses. Nonfat, low-sodium ricotta or cottage cheese. Low-fat or nonfat yogurt. Low-fat, low-sodium cheese. Fats and oils  Soft margarine without trans fats. Vegetable oil. Low-fat, reduced-fat, or light mayonnaise and salad dressings (reduced-sodium). Canola, safflower, olive, soybean, and sunflower oils. Avocado. Seasoning and other foods  Herbs. Spices. Seasoning mixes without salt. Unsalted popcorn and pretzels. Fat-free sweets. What foods are not recommended? The items listed may not be a complete list. Talk with your dietitian about what dietary choices are best for you. Grains  Baked goods made with fat, such as croissants, muffins, or some breads. Dry pasta or rice meal packs. Vegetables  Creamed or fried vegetables. Vegetables in a cheese sauce. Regular canned vegetables (not low-sodium or reduced-sodium). Regular canned tomato sauce and paste (not low-sodium or reduced-sodium). Regular tomato and vegetable juice (not low-sodium or reduced-sodium). Rosita Fire. Olives. Fruits  Canned fruit in a light or heavy syrup. Fried fruit. Fruit in cream or butter sauce. Meat and other protein foods  Fatty cuts of  meat. Ribs. Fried meat. Tomasa Blase. Sausage. Bologna and other processed lunch meats. Salami. Fatback. Hotdogs. Bratwurst. Salted nuts and seeds. Canned beans with added salt. Canned or smoked fish. Whole eggs or egg yolks. Chicken or [place] with skin. Dairy  Whole or 2% milk, cream, and half-and-half. Whole or full-fat cream cheese. Whole-fat or sweetened yogurt. Full-fat cheese. Nondairy creamers. Whipped toppings. Processed cheese and cheese spreads. Fats and oils  Butter. Stick margarine. Lard. Shortening. Ghee. Bacon fat. Tropical oils, such as coconut, palm kernel, or palm oil. Seasoning and other foods  Salted popcorn and pretzels. Onion salt, garlic salt, seasoned salt, table salt, and sea salt. Worcestershire sauce. Tartar sauce. Barbecue sauce. Teriyaki sauce. Soy sauce, including reduced-sodium. Steak sauce. Canned and packaged gravies. Fish sauce. Oyster sauce. Cocktail sauce. Horseradish that you find on the shelf. Ketchup. Mustard. Meat flavorings and tenderizers. Bouillon cubes. Hot sauce and Tabasco sauce. Premade or packaged marinades. Premade or packaged taco seasonings. Relishes. Regular salad dressings. Where to find more information:  National Heart, Lung, and Blood Institute: PopSteam.is  American Heart Association: www.heart.org Summary  The DASH eating plan is a healthy eating plan that has been shown  to reduce high blood pressure (hypertension). It may also reduce your risk for type 2 diabetes, heart disease, and stroke.  With the DASH eating plan, you should limit salt (sodium) intake to 2,300 mg a day. If you have hypertension, you may need to reduce your sodium intake to 1,500 mg a day.  When on the DASH eating plan, aim to eat more fresh fruits and vegetables, whole grains, lean proteins, low-fat dairy, and heart-healthy fats.  Work with your health care provider or diet and nutrition specialist (dietitian) to adjust your eating plan to your individual calorie  needs. This information is not intended to replace advice given to you by your health care provider. Make sure you discuss any questions you have with your health care provider. Document Released: 05/22/2011 Document Revised: 05/26/2016 Document Reviewed: 05/26/2016 Elsevier Interactive Patient Education  2017 [employer].  How to Take Your Blood Pressure You can take your blood pressure at home with a machine. You may need to check your blood pressure at home:  To check if you have high blood pressure (hypertension).  To check your blood pressure over time.  To make sure your blood pressure medicine is working. Supplies needed: You will need a blood pressure machine, or monitor. You can buy one at a drugstore or online. When choosing one:  Choose one with an arm cuff.  Choose one that wraps around your upper arm. Only one finger should fit between your arm and the cuff.  Do not choose one that measures your blood pressure from your wrist or finger. Your doctor can suggest a monitor. How to prepare Avoid these things for 30 minutes before checking your blood pressure:  Drinking caffeine.  Drinking alcohol.  Eating.  Smoking.  Exercising. Five minutes before checking your blood pressure:  Pee.  Sit in a dining chair. Avoid sitting in a soft couch or armchair.  Be quiet. Do not talk. How to take your blood pressure Follow the instructions that came with your machine. If you have a digital blood pressure monitor, these may be the instructions: 1. Sit up straight. 2. Place your feet on the floor. Do not cross your ankles or legs. 3. Rest your left arm at the level of your heart. You may rest it on a table, desk, or chair. 4. Pull up your shirt sleeve. 5. Wrap the blood pressure cuff around the upper part of your left arm. The cuff should be 1 inch (2.5 cm) above your elbow. It is best to wrap the cuff around bare skin. 6. Fit the cuff snugly around your arm. You should  be able to place only one finger between the cuff and your arm. 7. Put the cord inside the groove of your elbow. 8. Press the power button. 9. Sit quietly while the cuff fills with air and loses air. 10. Write down the numbers on the screen. 11. Wait 2-3 minutes and then repeat steps 1-10. What do the numbers mean? Two numbers make up your blood pressure. The first number is called systolic pressure. The second is called diastolic pressure. An example of a blood pressure reading is "120 over 80" (or 120/80). If you are an adult and do not have a medical condition, use this guide to find out if your blood pressure is normal: Normal   First number: below 120.  Second number: below 80. Elevated   First number: 120-129.  Second number: below 80. Hypertension stage 1   First number: 130-139.  Second  number: 80-89. Hypertension stage 2   First number: 140 or above.  Second number: 90 or above. Your blood pressure is above normal even if only the top or bottom number is above normal. Follow these instructions at home:  Check your blood pressure as often as your doctor tells you to.  Take your monitor to your next doctor's appointment. Your doctor will:  Make sure you are using it correctly.  Make sure it is working right.  Make sure you understand what your blood pressure numbers should be.  Tell your doctor if your medicines are causing side effects. Contact a doctor if:  Your blood pressure keeps being high. Get help right away if:  Your first blood pressure number is higher than 180.  Your second blood pressure number is higher than 120. This information is not intended to replace advice given to you by your health care provider. Make sure you discuss any questions you have with your health care provider. Document Released: 05/15/2008 Document Revised: 04/30/2016 Document Reviewed: 11/09/2015 Elsevier Interactive Patient Education  2017 [employer].    IF you  received an x-ray today, you will receive an invoice from [facility]. Please contact [facility] at [phone] with questions or concerns regarding your invoice.   IF you received labwork today, you will receive an invoice from [facility]. Please contact [facility] at [phone] with questions or concerns regarding your invoice.   Our billing staff will not be able to assist you with questions regarding bills from these companies.  You will be contacted with the lab results as soon as they are available. The fastest way to get your results is to activate your My Chart account. Instructions are located on the last page of this paperwork. If you have not heard from [place] regarding the results in 2 weeks, please contact this office.

## 2016-08-26 NOTE — Progress Notes (Signed)
    MRN: [id number] DOB: 13-Aug-1980  Subjective:   Keith English is a 36 y.o. male presenting for follow up on Hypertension.   Currently managed with norvasc 93m. Patient is not checking blood pressure at home.  Reports intermittent headache.  Denies lightheadedness, dizziness, double vision, chest pain, shortness of breath, heart racing, palpitations, nausea, vomiting, abdominal pain, hematuria, lower leg swelling. Denies smoking.  Has occasional alcohol intake.   Diet: He eats fast food, snacks, pepsi, and coffee.   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   Keith English a current medication list which includes the following prescription(s): multivitamin, acetaminophen, amlodipine, diclofenac, lisinopril, and valacyclovir. Also has No Known Allergies.  Keith English has a past medical history of Hypertension. Also  has a past surgical history that includes Ankle surgery (02/19/2005); Hip surgery; Hip surgery (02/19/2005); and ORIF finger fracture (02/19/2005).   Objective:   Vitals: BP (!) 158/94   Pulse 96   Temp 98 F (36.7 C) (Oral)   Resp 16   Ht _0  (1.905 m)   Wt 291 lb (132 kg)   SpO2 98%   BMI 36.37 kg/m   Physical Exam  Constitutional: He is oriented to person, place, and time. He appears well-developed and well-nourished.  HENT:  Head: Normocephalic and atraumatic.  Eyes: Conjunctivae are normal.  Neck: Normal range of motion.  Cardiovascular: Normal rate, regular rhythm, normal heart sounds and intact distal pulses.   Pulmonary/Chest: Effort normal and breath sounds normal.  Musculoskeletal:       Right lower leg: He exhibits no swelling.       Left lower leg: He exhibits no swelling.  Neurological: He is alert and oriented to person, place, and time.  Skin: Skin is warm and dry.  Psychiatric: He has a normal mood and affect.  Vitals reviewed.   BP Readings from Last 3 Encounters:  08/26/16 (!) 158/94  09/25/15 (!)  162/98  09/03/15 138/86   No results found for this or any previous visit (from the past 24 hour(s)).  Assessment and Plan :  1. Essential hypertension This is the first time I have met this patient. We discussed dietary changes in depth. Will add lisinopril 143mto his current bp regimen of amlodipine 1085maily. Encouraged to check bp outside of office, goal is <140/90. Return in 1 week for bp recheck, may have to increase lisinopril dose at next visit.  - CMP14+EGFR - lisinopril (PRINIVIL,ZESTRIL) 10 MG tablet; Take 1 tablet (10 mg total) by mouth daily.  Dispense: 30 tablet; Refill: 0 - amLODipine (NORVASC) 10 MG tablet; 1 tablet daily  Dispense: 90 tablet; Refill: 1  MarionA-C  [facility] 08/26/2016 7:32 PM

## 2016-08-27 LAB — CMP14+EGFR
A/G RATIO: 1.7 (ref 1.2–2.2)
ALBUMIN: 5 g/dL (ref 3.5–5.5)
ALK PHOS: 69 IU/L (ref 39–117)
ALT: 28 IU/L (ref 0–44)
AST: 25 IU/L (ref 0–40)
BILIRUBIN TOTAL: 0.7 mg/dL (ref 0.0–1.2)
BUN / CREAT RATIO: 11 (ref 9–20)
BUN: 10 mg/dL (ref 6–20)
CHLORIDE: 98 mmol/L (ref 96–106)
CO2: 26 mmol/L (ref 18–29)
Calcium: 9.8 mg/dL (ref 8.7–10.2)
Creatinine, Ser: 0.92 mg/dL (ref 0.76–1.27)
GFR calc non Af Amer: 107 mL/min/{1.73_m2} (ref >59)
GFR, EST AFRICAN AMERICAN: 124 mL/min/{1.73_m2} (ref >59)
GLOBULIN, TOTAL: 3 g/dL (ref 1.5–4.5)
Glucose: 105 mg/dL — ABNORMAL HIGH (ref 65–99)
Potassium: 4.4 mmol/L (ref 3.5–5.2)
SODIUM: 139 mmol/L (ref 134–144)
TOTAL PROTEIN: 8 g/dL (ref 6.0–8.5)

## 2016-09-02 ENCOUNTER — Encounter: Payer: Self-pay | Admitting: Emergency Medicine

## 2016-09-03 ENCOUNTER — Ambulatory Visit (INDEPENDENT_AMBULATORY_CARE_PROVIDER_SITE_OTHER): Payer: BLUE CROSS/BLUE SHIELD | Admitting: Physician Assistant

## 2016-09-03 ENCOUNTER — Encounter: Payer: Self-pay | Admitting: Physician Assistant

## 2016-09-03 VITALS — BP 138/84 | HR 89 | Temp 98.2°F | Resp 18 | Ht 75.0 in | Wt 290.0 lb

## 2016-09-03 DIAGNOSIS — I10 Essential (primary) hypertension: Secondary | ICD-10-CM

## 2016-09-03 MED ORDER — LISINOPRIL 20 MG PO TABS: 20.0000 mg | ORAL_TABLET | Freq: Every day | ORAL | 0 refills | Status: DC

## 2016-09-03 NOTE — Progress Notes (Signed)
    MRN: [id number] DOB: 05/17/1981  Subjective:   Keith English is a 36 y.o. male presenting for follow up on Hypertension.   Currently managed with amlodipine 10mg  and lisinopril 10mg . Does note that for the past two days he has taken lisinopril 20mg . Patient is checking blood pressure at home, range is 130-140s systolic. Reports headache has completely imporved since last visit. Denies lightheadedness, dizziness, chronic headache, double vision, chest pain, shortness of breath, heart racing, palpitations, nausea, vomiting, abdominal pain, hematuria, lower leg swelling.   He has changed his diet since last week. He has cut down on all extra sodium. Stopped eating fast food and drinking sodas. He has also started exercising daily.   Keith English has a current medication list which includes the following prescription(s): amlodipine, acetaminophen, lisinopril, multivitamin, and valacyclovir. Also has No Known Allergies.  Keith English  has a past medical history of Hypertension. Also  has a past surgical history that includes Ankle surgery (02/19/2005); Hip surgery; Hip surgery (02/19/2005); and ORIF finger fracture (02/19/2005).   Objective:   Vitals: BP 138/84   Pulse 89   Temp 98.2 F (36.8 C) (Oral)   Resp 18   Ht 6\' 3"  (1.905 m)   Wt 290 lb (131.5 kg)   SpO2 97%   BMI 36.25 kg/m   Physical Exam  Constitutional: He is oriented to person, place, and time. He appears well-developed and well-nourished.  HENT:  Head: Normocephalic and atraumatic.  Eyes: Conjunctivae are normal.  Neck: Normal range of motion.  Cardiovascular: Normal rate, regular rhythm and normal heart sounds.   Pulmonary/Chest: Effort normal.  Musculoskeletal:       Right lower leg: He exhibits no swelling.       Left lower leg: He exhibits no swelling.  Neurological: He is alert and oriented to person, place, and time.  Skin: Skin is warm and dry.  Psychiatric: He has a normal mood and affect.  Vitals  reviewed.   BP Readings from Last 3 Encounters:  09/03/16 138/84  08/26/16 (!) 158/94  09/25/15 (!) 162/98    No results found for this or any previous visit (from the past 24 hour(s)).  Assessment and Plan :  1. Essential hypertension Instructed to continue amlodipine 10mg  and lisinopril 20mg  daily. Continue healthy lifestyle modifications. Instructed that if his bp outside of the office is consistently >140/90 over the next couple weeks, return for recheck, otherwise, he is likely ready to have his DOT in a week as his bp is controlled within range today. Otherwise, pt to follow up in 4-6  weeks.  - lisinopril (PRINIVIL,ZESTRIL) 20 MG tablet; Take 1 tablet (20 mg total) by mouth daily.  Dispense: 30 tablet; Refill: 0  Keith CoreBrittany Virgal [place], PA-C  [facility] 09/03/2016 5:54 PM

## 2016-09-03 NOTE — Patient Instructions (Addendum)
Continue taking amlodipine 10mg  and start taking 20mg  of lisinopril daily. Continue the healthy diet and exercise plan you have going.  Monitor your bp over the next week while both medications are taking full effect. If you are consistently >140/90, come back and see me next week. If you are controlled <140/90, then you do not have to come back, just wait for your DOT. If I do not see you before your birthday, HAPPY BIRTHDAY!     IF you received an x-ray today, you will receive an [facility]. Please contact [facility] at [phone] with questions or concerns regarding your invoice.   IF you received labwork today, you will receive an [facility]. Please contact [facility] at [phone] with questions or concerns regarding your invoice.   Our billing staff will not be able to assist you with questions regarding bills from these companies.  You will be contacted with the lab results as soon as they are available. The fastest way to get your results is to activate your My Chart account. Instructions are located on the last page of this paperwork. If you have not heard from us regarding the results in 2 weeks, please contact this office.

## 2016-09-03 NOTE — Progress Notes (Deleted)
    MRN: [id number] DOB: 11/14/1980  Subjective:   Keith English is a 36 y.o. male presenting for follow up on Hypertension.   *** PLEASE HELP ME I'M STUCK IN THE COMPUTER!!!***  Currently managed with ***. Patient {is/are not:32546} checking blood pressure at home, range is *** systolic. Reports ***. Denies lightheadedness, dizziness, chronic headache, double vision, chest pain, shortness of breath, heart racing, palpitations, nausea, vomiting, abdominal pain, hematuria, lower leg swelling. *** smoking, *** alcohol. Denies any other aggravating or relieving factors, no other questions or concerns.  Keith English has a current medication list which includes the following prescription(s): amlodipine, lisinopril, acetaminophen, multivitamin, and valacyclovir. Also has No Known Allergies.  Keith English  has a past medical history of Hypertension. Also  has a past surgical history that includes Ankle surgery (02/19/2005); Hip surgery; Hip surgery (02/19/2005); and ORIF finger fracture (02/19/2005).   Objective:   Vitals: BP (!) 147/82   Pulse 89   Temp 98.2 F (36.8 C) (Oral)   Resp 18   Ht 6\' 3"  (1.905 m)   Wt 290 lb (131.5 kg)   SpO2 97%   BMI 36.25 kg/m   Physical Exam  No results found for this or any previous visit (from the past 24 hour(s)).  Assessment and Plan :  There are no diagnoses linked to this encounter.  Keith CoreBrittany Aviendha Azbell, PA-C  [facility] 09/03/2016 5:18 PM

## 2016-09-08 ENCOUNTER — Ambulatory Visit (INDEPENDENT_AMBULATORY_CARE_PROVIDER_SITE_OTHER): Payer: Self-pay | Admitting: Urgent Care

## 2016-09-08 VITALS — BP 130/84 | HR 95 | Temp 98.3°F | Resp 17 | Ht 75.5 in | Wt 287.0 lb

## 2016-09-08 DIAGNOSIS — Z8781 Personal history of (healed) traumatic fracture: Secondary | ICD-10-CM

## 2016-09-08 DIAGNOSIS — Z024 Encounter for examination for driving license: Secondary | ICD-10-CM

## 2016-09-08 DIAGNOSIS — I10 Essential (primary) hypertension: Secondary | ICD-10-CM

## 2016-09-08 NOTE — Progress Notes (Signed)
   Commercial Driver Medical Examination   Roselind MessierLanier A Lesinski is a 36 y.o. male who presents today for a DOT physical exam. The patient reports history of fracture of left femur in 2006. He had this surgically repaired, spent some time in a hospital, without sequelae. Denies dizziness, chronic headache, blurred vision, chest pain, shortness of breath, heart racing, palpitations, nausea, vomiting, abdominal pain, hematuria, lower leg swelling. Denies smoking cigarettes or drinking alcohol.   The following portions of the patient's history were reviewed and updated as appropriate: allergies, current medications, past family history, past medical history, past social history and past surgical history.  Objective:   BP (!) 157/78   Pulse 95   Temp 98.3 F (36.8 C) (Oral)   Resp 17   Ht 6' 3.5" (1.918 m)   Wt 287 lb (130.2 kg)   SpO2 97%   BMI 35.40 kg/m   Vision/hearing:  Visual Acuity Screening   Right eye Left eye Both eyes  Without correction: 20/15 20/15 20/15   With correction:     Hearing Screening Comments: Peripheral Vision: Right eye 85 degrees. Left eye 85 degrees. The patient can distinguish the colors red, amber and green. The patient was able to hear a forced whisper from L=10 R=10 feet.  Patient can recognize and distinguish among traffic control signals and devices showing standard red, green, and amber colors.  Corrective lenses required: No  Monocular Vision?: No  Hearing aid requirement: No  Physical Exam  Constitutional: He is oriented to person, place, and time. He appears well-developed and well-nourished.  HENT:  TM's intact bilaterally, no effusions or erythema. Nasal turbinates pink and moist, nasal passages patent. No sinus tenderness. Oropharynx clear, mucous membranes moist, dentition in good repair.  Eyes: Conjunctivae and EOM are normal. Pupils are equal, round, and reactive to light. Right eye exhibits no discharge. Left eye exhibits no discharge.  No scleral icterus.  Neck: Normal range of motion. Neck supple. No thyromegaly present.  Cardiovascular: Normal rate, regular rhythm and intact distal pulses.  Exam reveals no gallop and no friction rub.   No murmur heard. Pulmonary/Chest: No stridor. No respiratory distress. He has no wheezes. He has no rales.  Abdominal: Soft. Bowel sounds are normal. He exhibits no distension and no mass. There is no tenderness.  Musculoskeletal: Normal range of motion. He exhibits no edema or tenderness.  Lymphadenopathy:    He has no cervical adenopathy.  Neurological: He is alert and oriented to person, place, and time. He has normal reflexes.  Skin: Skin is warm and dry. No rash noted. No erythema. No pallor.  Psychiatric: He has a normal mood and affect.   Labs: Comments: spgr:1.010 Pro:neg Blood:neg Glu:neg  Assessment:    Healthy male exam.  Meets standards, but periodic monitoring required due to HTN.  Driver qualified only for 1 year.    Plan:   Medical examiners certificate completed and printed. Return as needed.  Wallis BambergMario Aubrianne Molyneux, PA-C [facility] [phone] 09/08/2016  9:06 AM

## 2016-09-08 NOTE — Patient Instructions (Addendum)
Hypertension Hypertension, commonly called high blood pressure, is when the force of blood pumping through the arteries is too strong. The arteries are the blood vessels that carry blood from the heart throughout the body. Hypertension forces the heart to work harder to pump blood and may cause arteries to become narrow or stiff. Having untreated or uncontrolled hypertension can cause heart attacks, strokes, kidney disease, and other problems. A blood pressure reading consists of a higher number over a lower number. Ideally, your blood pressure should be below 120/80. The first ("top") number is called the systolic pressure. It is a measure of the pressure in your arteries as your heart beats. The second ("bottom") number is called the diastolic pressure. It is a measure of the pressure in your arteries as the heart relaxes. What are the causes? The cause of this condition is not known. What increases the risk? Some risk factors for high blood pressure are under your control. Others are not. Factors you can change   Smoking.  Having type 2 diabetes mellitus, high cholesterol, or both.  Not getting enough exercise or physical activity.  Being overweight.  Having too much fat, sugar, calories, or salt (sodium) in your diet.  Drinking too much alcohol. Factors that are difficult or impossible to change   Having chronic kidney disease.  Having a family history of high blood pressure.  Age. Risk increases with age.  Race. You may be at higher risk if you are African-American.  Gender. Men are at higher risk than women before age 45. After age 65, women are at higher risk than men.  Having obstructive sleep apnea.  Stress. What are the signs or symptoms? Extremely high blood pressure (hypertensive crisis) may cause:  Headache.  Anxiety.  Shortness of breath.  Nosebleed.  Nausea and vomiting.  Severe chest pain.  Jerky movements you cannot control (seizures). How is this  diagnosed? This condition is diagnosed by measuring your blood pressure while you are seated, with your arm resting on a surface. The cuff of the blood pressure monitor will be placed directly against the skin of your upper arm at the level of your heart. It should be measured at least twice using the same arm. Certain conditions can cause a difference in blood pressure between your right and left arms. Certain factors can cause blood pressure readings to be lower or higher than normal (elevated) for a short period of time:  When your blood pressure is higher when you are in a health care provider's office than when you are at home, this is called white coat hypertension. Most people with this condition do not need medicines.  When your blood pressure is higher at home than when you are in a health care provider's office, this is called masked hypertension. Most people with this condition may need medicines to control blood pressure. If you have a high blood pressure reading during one visit or you have normal blood pressure with other risk factors:  You may be asked to return on a different day to have your blood pressure checked again.  You may be asked to monitor your blood pressure at home for 1 week or longer. If you are diagnosed with hypertension, you may have other blood or imaging tests to help your health care provider understand your overall risk for other conditions. How is this treated? This condition is treated by making healthy lifestyle changes, such as eating healthy foods, exercising more, and reducing your alcohol intake. Your health   care provider may prescribe medicine if lifestyle changes are not enough to get your blood pressure under control, and if:  Your systolic blood pressure is above 130.  Your diastolic blood pressure is above 80. Your personal target blood pressure may vary depending on your medical conditions, your age, and other factors. Follow these instructions  at home: Eating and drinking   Eat a diet that is high in fiber and potassium, and low in sodium, added sugar, and fat. An example eating plan is called the DASH (Dietary Approaches to Stop Hypertension) diet. To eat this way:  Eat plenty of fresh fruits and vegetables. Try to fill half of your plate at each meal with fruits and vegetables.  Eat whole grains, such as whole wheat pasta, brown rice, or whole grain bread. Fill about one quarter of your plate with whole grains.  Eat or drink low-fat dairy products, such as skim milk or low-fat yogurt.  Avoid fatty cuts of meat, processed or cured meats, and poultry with skin. Fill about one quarter of your plate with lean proteins, such as fish, chicken without skin, beans, eggs, and tofu.  Avoid premade and processed foods. These tend to be higher in sodium, added sugar, and fat.  Reduce your daily sodium intake. Most people with hypertension should eat less than 1,500 mg of sodium a day.  Limit alcohol intake to no more than 1 drink a day for nonpregnant women and 2 drinks a day for men. One drink equals 12 oz of beer, 5 oz of wine, or 1 oz of hard liquor. Lifestyle   Work with your health care provider to maintain a healthy body weight or to lose weight. Ask what an ideal weight is for you.  Get at least 30 minutes of exercise that causes your heart to beat faster (aerobic exercise) most days of the week. Activities may include walking, swimming, or biking.  Include exercise to strengthen your muscles (resistance exercise), such as pilates or lifting weights, as part of your weekly exercise routine. Try to do these types of exercises for 30 minutes at least 3 days a week.  Do not use any products that contain nicotine or tobacco, such as cigarettes and e-cigarettes. If you need help quitting, ask your health care provider.  Monitor your blood pressure at home as told by your health care provider.  Keep all follow-up visits as told by  your health care provider. This is important. Medicines   Take over-the-counter and prescription medicines only as told by your health care provider. Follow directions carefully. Blood pressure medicines must be taken as prescribed.  Do not skip doses of blood pressure medicine. Doing this puts you at risk for problems and can make the medicine less effective.  Ask your health care provider about side effects or reactions to medicines that you should watch for. Contact a health care provider if:  You think you are having a reaction to a medicine you are taking.  You have headaches that keep coming back (recurring).  You feel dizzy.  You have swelling in your ankles.  You have trouble with your vision. Get help right away if:  You develop a severe headache or confusion.  You have unusual weakness or numbness.  You feel faint.  You have severe pain in your chest or abdomen.  You vomit repeatedly.  You have trouble breathing. Summary  Hypertension is when the force of blood pumping through your arteries is too strong. If this condition is   not controlled, it may put you at risk for serious complications.  Your personal target blood pressure may vary depending on your medical conditions, your age, and other factors. For most people, a normal blood pressure is less than 120/80.  Hypertension is treated with lifestyle changes, medicines, or a combination of both. Lifestyle changes include weight loss, eating a healthy, low-sodium diet, exercising more, and limiting alcohol. This information is not intended to replace advice given to you by your health care provider. Make sure you discuss any questions you have with your health care provider. Document Released: 06/02/2005 Document Revised: 04/30/2016 Document Reviewed: 04/30/2016 Elsevier Interactive Patient Education  2017 Elsevier Inc.     IF you received an x-ray today, you will receive an invoice from [facility].  Please contact [facility] at [phone] with questions or concerns regarding your invoice.   IF you received labwork today, you will receive an invoice from [facility]. Please contact [facility] at [phone] with questions or concerns regarding your invoice.   Our billing staff will not be able to assist you with questions regarding bills from these companies.  You will be contacted with the lab results as soon as they are available. The fastest way to get your results is to activate your My Chart account. Instructions are located on the last page of this paperwork. If you have not heard from us regarding the results in 2 weeks, please contact this office.      

## 2016-10-16 ENCOUNTER — Other Ambulatory Visit: Payer: Self-pay | Admitting: Physician Assistant

## 2016-10-16 DIAGNOSIS — I10 Essential (primary) hypertension: Secondary | ICD-10-CM

## 2016-10-30 DIAGNOSIS — N419 Inflammatory disease of prostate, unspecified: Secondary | ICD-10-CM | POA: Diagnosis not present

## 2016-10-30 DIAGNOSIS — N3281 Overactive bladder: Secondary | ICD-10-CM | POA: Diagnosis not present

## 2016-11-11 DIAGNOSIS — M722 Plantar fascial fibromatosis: Secondary | ICD-10-CM | POA: Diagnosis not present

## 2016-11-26 ENCOUNTER — Other Ambulatory Visit: Payer: Self-pay | Admitting: Physician Assistant

## 2016-11-26 DIAGNOSIS — I10 Essential (primary) hypertension: Secondary | ICD-10-CM

## 2016-12-02 NOTE — Telephone Encounter (Signed)
Please call pt and schedule appointment for htn follow up.

## 2016-12-10 IMAGING — CR DG ABDOMEN 1V
1 series · 1 of 1 positions shown · non-contrast
Comparison: None.

CLINICAL DATA: Dysuria

EXAM:
ABDOMEN - 1 VIEW

[AP]
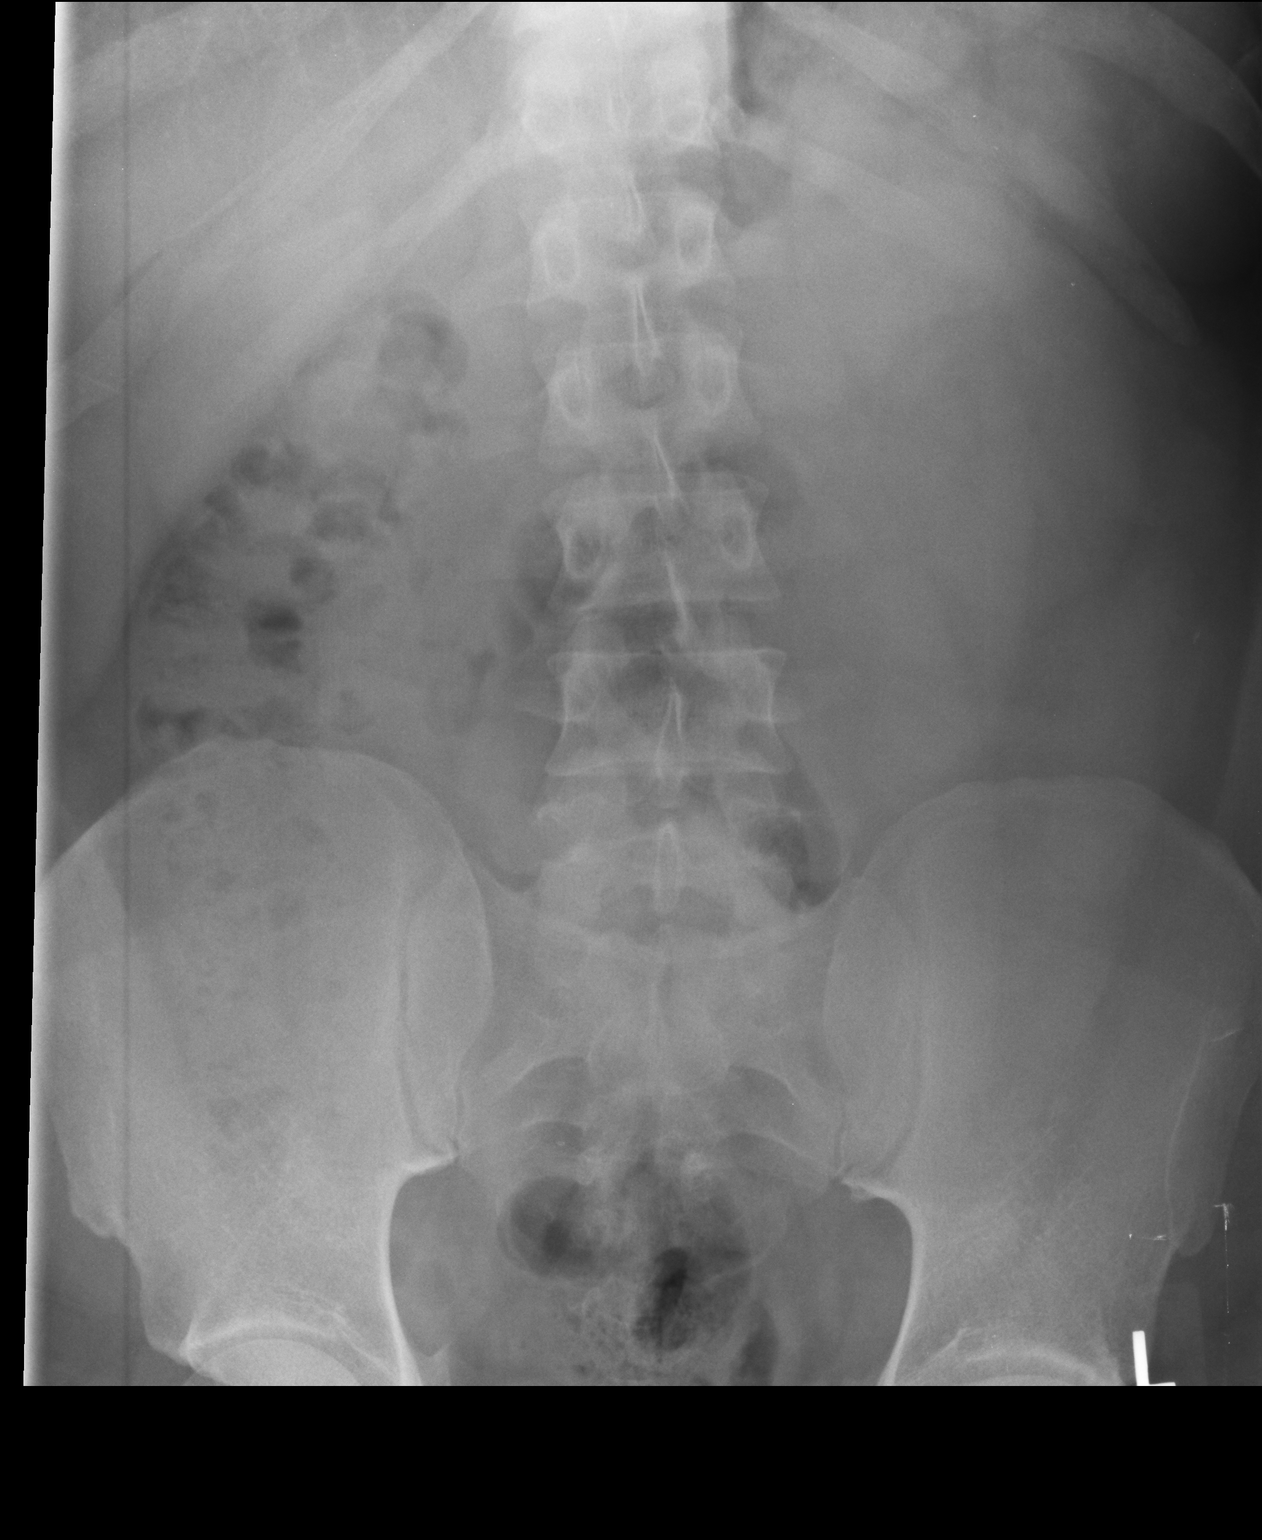

[1 of 1 positions shown; findings below may reference images not displayed]

FINDINGS: The bowel gas pattern is normal. No radio-opaque calculi or other
significant radiographic abnormality are seen.
IMPRESSION: Negative.

## 2016-12-25 ENCOUNTER — Other Ambulatory Visit: Payer: Self-pay | Admitting: *Deleted

## 2016-12-25 DIAGNOSIS — I10 Essential (primary) hypertension: Secondary | ICD-10-CM

## 2016-12-25 MED ORDER — LISINOPRIL 20 MG PO TABS: 20.0000 mg | ORAL_TABLET | Freq: Every day | ORAL | 0 refills | Status: DC

## 2017-01-20 DIAGNOSIS — M4802 Spinal stenosis, cervical region: Secondary | ICD-10-CM | POA: Diagnosis not present

## 2017-02-03 DIAGNOSIS — M4802 Spinal stenosis, cervical region: Secondary | ICD-10-CM | POA: Diagnosis not present

## 2017-02-17 DIAGNOSIS — I10 Essential (primary) hypertension: Secondary | ICD-10-CM | POA: Diagnosis not present

## 2017-02-17 DIAGNOSIS — Z6836 Body mass index (BMI) 36.0-36.9, adult: Secondary | ICD-10-CM | POA: Diagnosis not present

## 2017-04-03 DIAGNOSIS — N3281 Overactive bladder: Secondary | ICD-10-CM | POA: Diagnosis not present

## 2017-04-03 DIAGNOSIS — N341 Nonspecific urethritis: Secondary | ICD-10-CM | POA: Diagnosis not present

## 2017-04-03 DIAGNOSIS — R8271 Bacteriuria: Secondary | ICD-10-CM | POA: Diagnosis not present

## 2017-04-03 DIAGNOSIS — N419 Inflammatory disease of prostate, unspecified: Secondary | ICD-10-CM | POA: Diagnosis not present

## 2017-05-19 ENCOUNTER — Other Ambulatory Visit: Payer: Self-pay | Admitting: Physician Assistant

## 2017-05-19 DIAGNOSIS — I10 Essential (primary) hypertension: Secondary | ICD-10-CM

## 2017-07-22 ENCOUNTER — Other Ambulatory Visit: Payer: Self-pay | Admitting: Physician Assistant

## 2017-07-22 DIAGNOSIS — I10 Essential (primary) hypertension: Secondary | ICD-10-CM

## 2017-08-10 ENCOUNTER — Other Ambulatory Visit: Payer: Self-pay | Admitting: Physician Assistant

## 2017-08-10 DIAGNOSIS — I10 Essential (primary) hypertension: Secondary | ICD-10-CM

## 2017-08-29 ENCOUNTER — Encounter: Payer: Self-pay | Admitting: Urgent Care

## 2017-08-29 ENCOUNTER — Ambulatory Visit (INDEPENDENT_AMBULATORY_CARE_PROVIDER_SITE_OTHER): Payer: Self-pay | Admitting: Urgent Care

## 2017-08-29 ENCOUNTER — Other Ambulatory Visit: Payer: Self-pay

## 2017-08-29 VITALS — BP 128/78 | HR 86 | Temp 98.7°F | Resp 16 | Ht 75.5 in | Wt 284.0 lb

## 2017-08-29 DIAGNOSIS — Z8781 Personal history of (healed) traumatic fracture: Secondary | ICD-10-CM

## 2017-08-29 DIAGNOSIS — I10 Essential (primary) hypertension: Secondary | ICD-10-CM

## 2017-08-29 DIAGNOSIS — Z024 Encounter for examination for driving license: Secondary | ICD-10-CM

## 2017-08-29 NOTE — Patient Instructions (Addendum)
Health Maintenance, Male A healthy lifestyle and preventive care is important for your health and wellness. Ask your health care provider about what schedule of regular examinations is right for you. What should I know about weight and diet? Eat a Healthy Diet  Eat plenty of vegetables, fruits, whole grains, low-fat dairy products, and lean protein.  Do not eat a lot of foods high in solid fats, added sugars, or salt.  Maintain a Healthy Weight Regular exercise can help you achieve or maintain a healthy weight. You should:  Do at least 150 minutes of exercise each week. The exercise should increase your heart rate and make you sweat (moderate-intensity exercise).  Do strength-training exercises at least twice a week.  Watch Your Levels of Cholesterol and Blood Lipids  Have your blood tested for lipids and cholesterol every 5 years starting at 37 years of age. If you are at high risk for heart disease, you should start having your blood tested when you are 37 years old. You may need to have your cholesterol levels checked more often if: ? Your lipid or cholesterol levels are high. ? You are older than 37 years of age. ? You are at high risk for heart disease.  What should I know about cancer screening? Many types of cancers can be detected early and may often be prevented. Lung Cancer  You should be screened every year for lung cancer if: ? You are a current smoker who has smoked for at least 30 years. ? You are a former smoker who has quit within the past 15 years.  Talk to your health care provider about your screening options, when you should start screening, and how often you should be screened.  Colorectal Cancer  Routine colorectal cancer screening usually begins at 37 years of age and should be repeated every 5-10 years until you are 37 years old. You may need to be screened more often if early forms of precancerous polyps or small growths are found. Your health care provider  may recommend screening at an earlier age if you have risk factors for colon cancer.  Your health care provider may recommend using home test kits to check for hidden blood in the stool.  A small camera at the end of a tube can be used to examine your colon (sigmoidoscopy or colonoscopy). This checks for the earliest forms of colorectal cancer.  Prostate and Testicular Cancer  Depending on your age and overall health, your health care provider may do certain tests to screen for prostate and testicular cancer.  Talk to your health care provider about any symptoms or concerns you have about testicular or prostate cancer.  Skin Cancer  Check your skin from head to toe regularly.  Tell your health care provider about any new moles or changes in moles, especially if: ? There is a change in a mole's size, shape, or color. ? You have a mole that is larger than a pencil eraser.  Always use sunscreen. Apply sunscreen liberally and repeat throughout the day.  Protect yourself by wearing long sleeves, pants, a wide-brimmed hat, and sunglasses when outside.  What should I know about heart disease, diabetes, and high blood pressure?  If you are 18-39 years of age, have your blood pressure checked every 3-5 years. If you are 40 years of age or older, have your blood pressure checked every year. You should have your blood pressure measured twice-once when you are at a hospital or clinic, and once when   you are not at a hospital or clinic. Record the average of the two measurements. To check your blood pressure when you are not at a hospital or clinic, you can use: ? An automated blood pressure machine at a pharmacy. ? A home blood pressure monitor.  Talk to your health care provider about your target blood pressure.  If you are between 45-79 years old, ask your health care provider if you should take aspirin to prevent heart disease.  Have regular diabetes screenings by checking your fasting blood  sugar level. ? If you are at a normal weight and have a low risk for diabetes, have this test once every three years after the age of 45. ? If you are overweight and have a high risk for diabetes, consider being tested at a younger age or more often.  A one-time screening for abdominal aortic aneurysm (AAA) by ultrasound is recommended for men aged 65-75 years who are current or former smokers. What should I know about preventing infection? Hepatitis B If you have a higher risk for hepatitis B, you should be screened for this virus. Talk with your health care provider to find out if you are at risk for hepatitis B infection. Hepatitis C Blood testing is recommended for:  Everyone born from 1945 through 1965.  Anyone with known risk factors for hepatitis C.  Sexually Transmitted Diseases (STDs)  You should be screened each year for STDs including gonorrhea and chlamydia if: ? You are sexually active and are younger than 37 years of age. ? You are older than 37 years of age and your health care provider tells you that you are at risk for this type of infection. ? Your sexual activity has changed since you were last screened and you are at an increased risk for chlamydia or gonorrhea. Ask your health care provider if you are at risk.  Talk with your health care provider about whether you are at high risk of being infected with HIV. Your health care provider may recommend a prescription medicine to help prevent HIV infection.  What else can I do?  Schedule regular health, dental, and eye exams.  Stay current with your vaccines (immunizations).  Do not use any tobacco products, such as cigarettes, chewing tobacco, and e-cigarettes. If you need help quitting, ask your health care provider.  Limit alcohol intake to no more than 2 drinks per day. One drink equals 12 ounces of beer, 5 ounces of wine, or 1 ounces of hard liquor.  Do not use street drugs.  Do not share needles.  Ask your  health care provider for help if you need support or information about quitting drugs.  Tell your health care provider if you often feel depressed.  Tell your health care provider if you have ever been abused or do not feel safe at home. This information is not intended to replace advice given to you by your health care provider. Make sure you discuss any questions you have with your health care provider. Document Released: 11/29/2007 Document Revised: 01/30/2016 Document Reviewed: 03/06/2015 Elsevier Interactive Patient Education  2018 Elsevier Inc.     IF you received an x-ray today, you will receive an invoice from [facility]. Please contact [facility] at [phone] with questions or concerns regarding your invoice.   IF you received labwork today, you will receive an invoice from [facility]. Please contact [facility] at [phone] with questions or concerns regarding your invoice.   Our billing staff will not be   able to assist you with questions regarding bills from these companies.  You will be contacted with the lab results as soon as they are available. The fastest way to get your results is to activate your My Chart account. Instructions are located on the last page of this paperwork. If you have not heard from us regarding the results in 2 weeks, please contact this office.       

## 2017-08-29 NOTE — Progress Notes (Addendum)
  Commercial Driver Medical Examination   Keith MessierLanier A English is a 37 y.o. male who presents today for a DOT physical exam. The patient reports history of HTN, managed with amlodipine and lisinopril. Has a history of left femur fracture without sequelae. Denies dizziness, chronic headache, blurred vision, chest pain, shortness of breath, heart racing, palpitations, nausea, vomiting, abdominal pain, hematuria, lower leg swelling. Denies smoking cigarettes.  The following portions of the patient's history were reviewed and updated as appropriate: allergies, current medications, past family history, past medical history, past social history and past surgical history.  Objective:   BP 128/78   Pulse 86   Temp 98.7 F (37.1 C)   Resp 16   Ht 6' 3.5" (1.918 m)   Wt 284 lb (128.8 kg)   SpO2 100%   BMI 35.03 kg/m   Wt Readings from Last 3 Encounters:  08/29/17 284 lb (128.8 kg)  09/08/16 287 lb (130.2 kg)  09/03/16 290 lb (131.5 kg)   Vision/hearing:  Visual Acuity Screening   Right eye Left eye Both eyes  Without correction: 20/20 20/20 20/25   With correction:     Comments: 85 degrees peripheral   Hearing Screening Comments: 10 feet whisper   Patient can recognize and distinguish among traffic control signals and devices showing standard red, green, and amber colors.  Corrective lenses required: No  Monocular Vision?: No  Hearing aid requirement: No  Physical Exam  Constitutional: He is oriented to person, place, and time. He appears well-developed and well-nourished.  HENT:  TM's intact bilaterally, no effusions or erythema. Nasal turbinates pink and moist, nasal passages patent. No sinus tenderness. Oropharynx clear, mucous membranes moist, dentition in good repair.  Eyes: Conjunctivae and EOM are normal. Pupils are equal, round, and reactive to light. Right eye exhibits no discharge. Left eye exhibits no discharge. No scleral icterus.  Neck: Normal range of motion. Neck  supple.  Cardiovascular: Normal rate, regular rhythm and intact distal pulses. Exam reveals no gallop and no friction rub.  No murmur heard. Pulmonary/Chest: No stridor. No respiratory distress. He has no wheezes. He has no rales.  Abdominal: Soft. Bowel sounds are normal. He exhibits no distension and no mass. There is no tenderness.  Musculoskeletal: Normal range of motion. He exhibits no edema or tenderness.       Hands: Lymphadenopathy:    He has no cervical adenopathy.  Neurological: He is alert and oriented to person, place, and time. He has normal reflexes. He displays normal reflexes. Coordination normal.  Skin: Skin is warm and dry. Capillary refill takes less than 2 seconds. No rash noted. No erythema. No pallor.  Psychiatric: He has a normal mood and affect.   Labs: Comments: UA- sp. gr-1.010, protein- neg , blood-neg, sugar-neg  Assessment:    Healthy male exam.  Meets standards, but periodic monitoring required due to HTN.  Driver qualified only for 1 year.    Plan:   Medical examiners certificate completed and printed. Return as needed.  Recommended patient set up a follow up appointment for his HTN management. He states that he does not need one because he just got refills but our records indicate that he has not been seen in ~1 year. Patient verbalized understanding.  Keith BambergMario Jabre Heo, PA-C [facility] [phone] 08/29/2017  8:59 AM

## 2017-09-04 ENCOUNTER — Ambulatory Visit: Payer: BLUE CROSS/BLUE SHIELD | Admitting: Urgent Care

## 2017-09-04 ENCOUNTER — Encounter: Payer: Self-pay | Admitting: Urgent Care

## 2017-09-04 ENCOUNTER — Ambulatory Visit (INDEPENDENT_AMBULATORY_CARE_PROVIDER_SITE_OTHER): Payer: BLUE CROSS/BLUE SHIELD

## 2017-09-04 VITALS — BP 147/88 | HR 93 | Temp 98.8°F | Resp 17 | Ht 75.5 in | Wt 288.0 lb

## 2017-09-04 DIAGNOSIS — R2232 Localized swelling, mass and lump, left upper limb: Secondary | ICD-10-CM

## 2017-09-04 DIAGNOSIS — R2231 Localized swelling, mass and lump, right upper limb: Secondary | ICD-10-CM | POA: Diagnosis not present

## 2017-09-04 IMAGING — DX DG FINGER INDEX 2+V*R*
3 series · 3 of 3 positions shown · non-contrast
Comparison: No recent prior.

CLINICAL DATA: Mass right ring finger.

EXAM:
RIGHT INDEX FINGER 2+V

[finger ap]
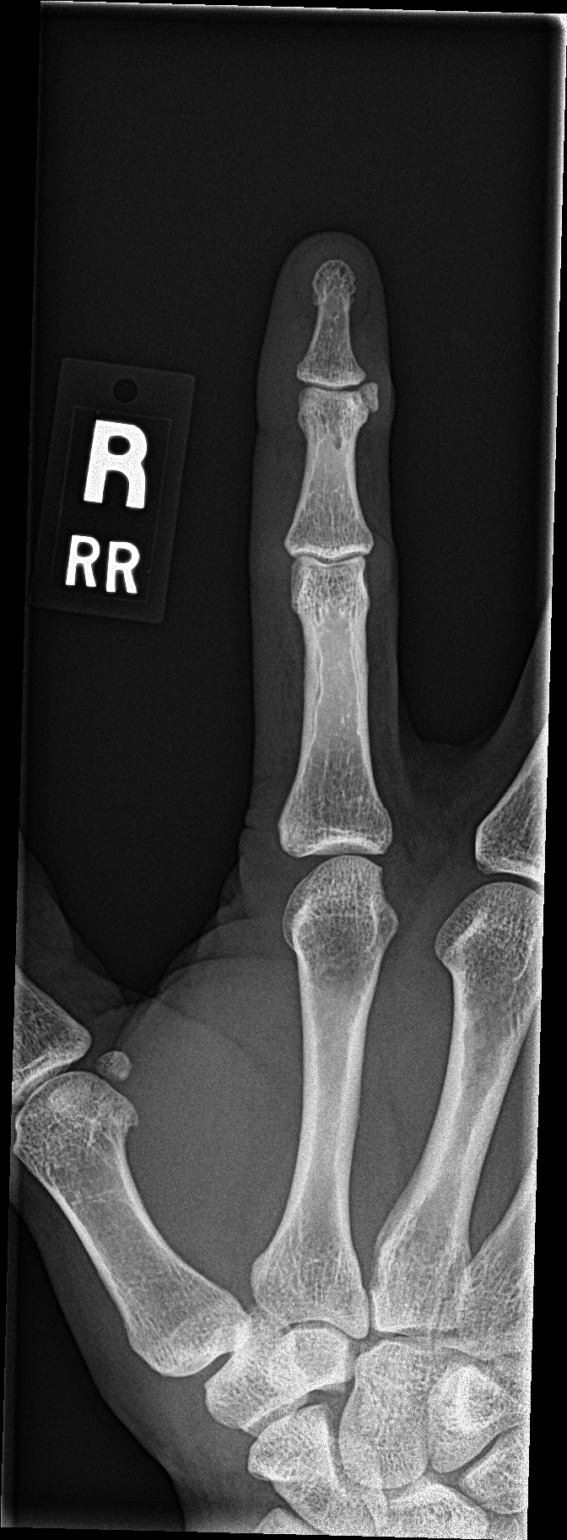

[finger obl]
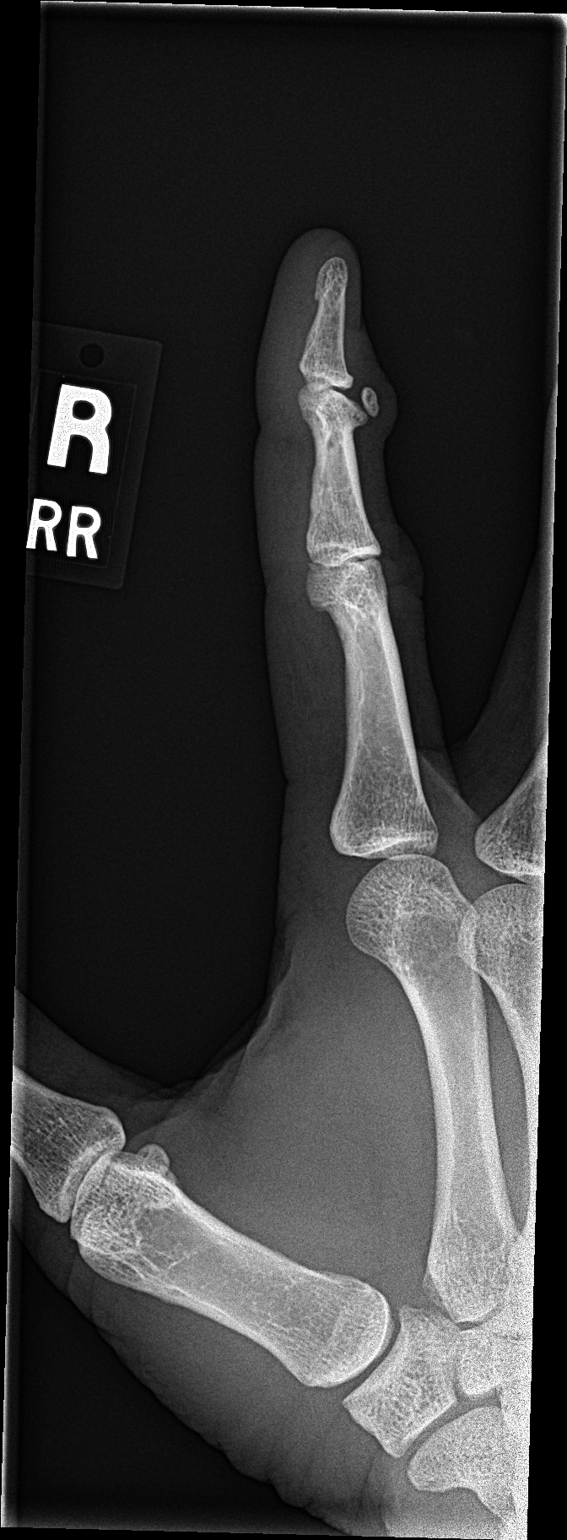

[finger lat]
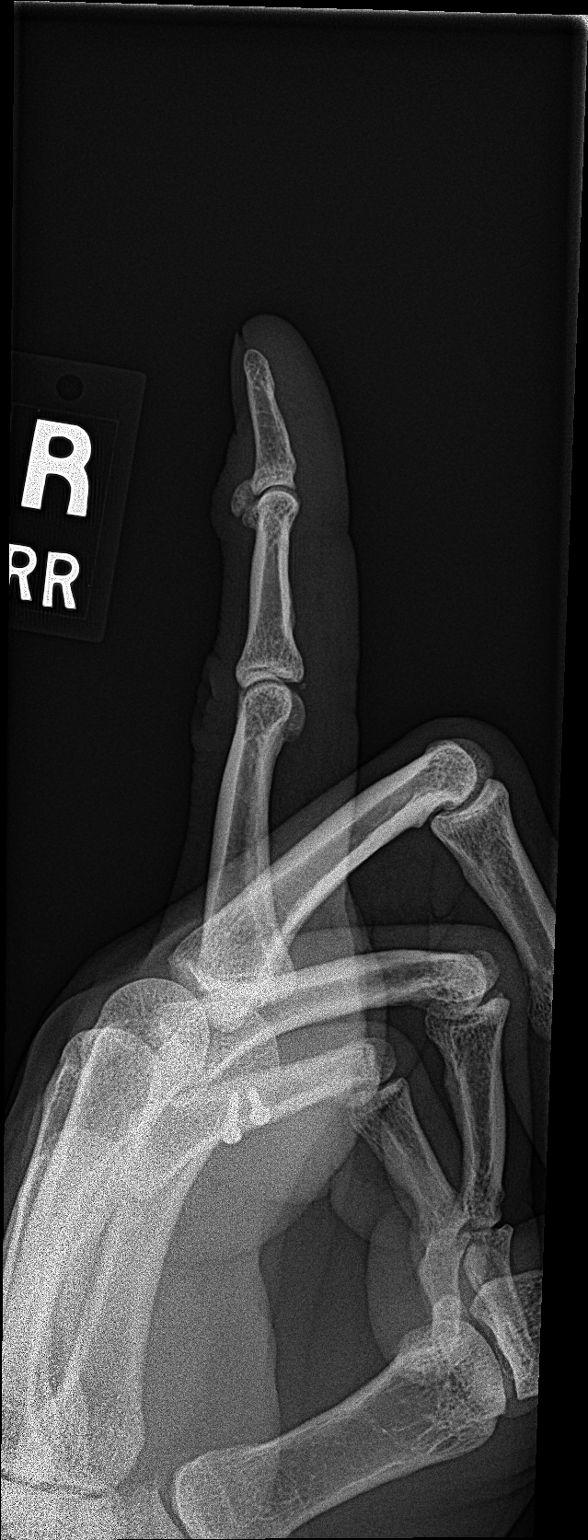

[3 of 3 positions shown; findings below may reference images not displayed]

FINDINGS: Corticated bony densities noted adjacent to the distal
interphalangeal joint of the right second digit. This most likely a
site of old fracture and/or degenerative change. No acute
abnormality. No radiopaque foreign body.
IMPRESSION: Corticated bony density noted adjacent to the distal interphalangeal
joint of the right second digit. This is most likely an old fracture
fragment and/or degenerative change. No acute abnormality.

## 2017-09-04 IMAGING — DX DG HAND COMPLETE 3+V*L*
3 series · 3 of 3 positions shown · non-contrast
Comparison: No recent prior.

CLINICAL DATA: Mass left hand.

EXAM:
LEFT HAND - COMPLETE 3+ VIEW

[hand pa]
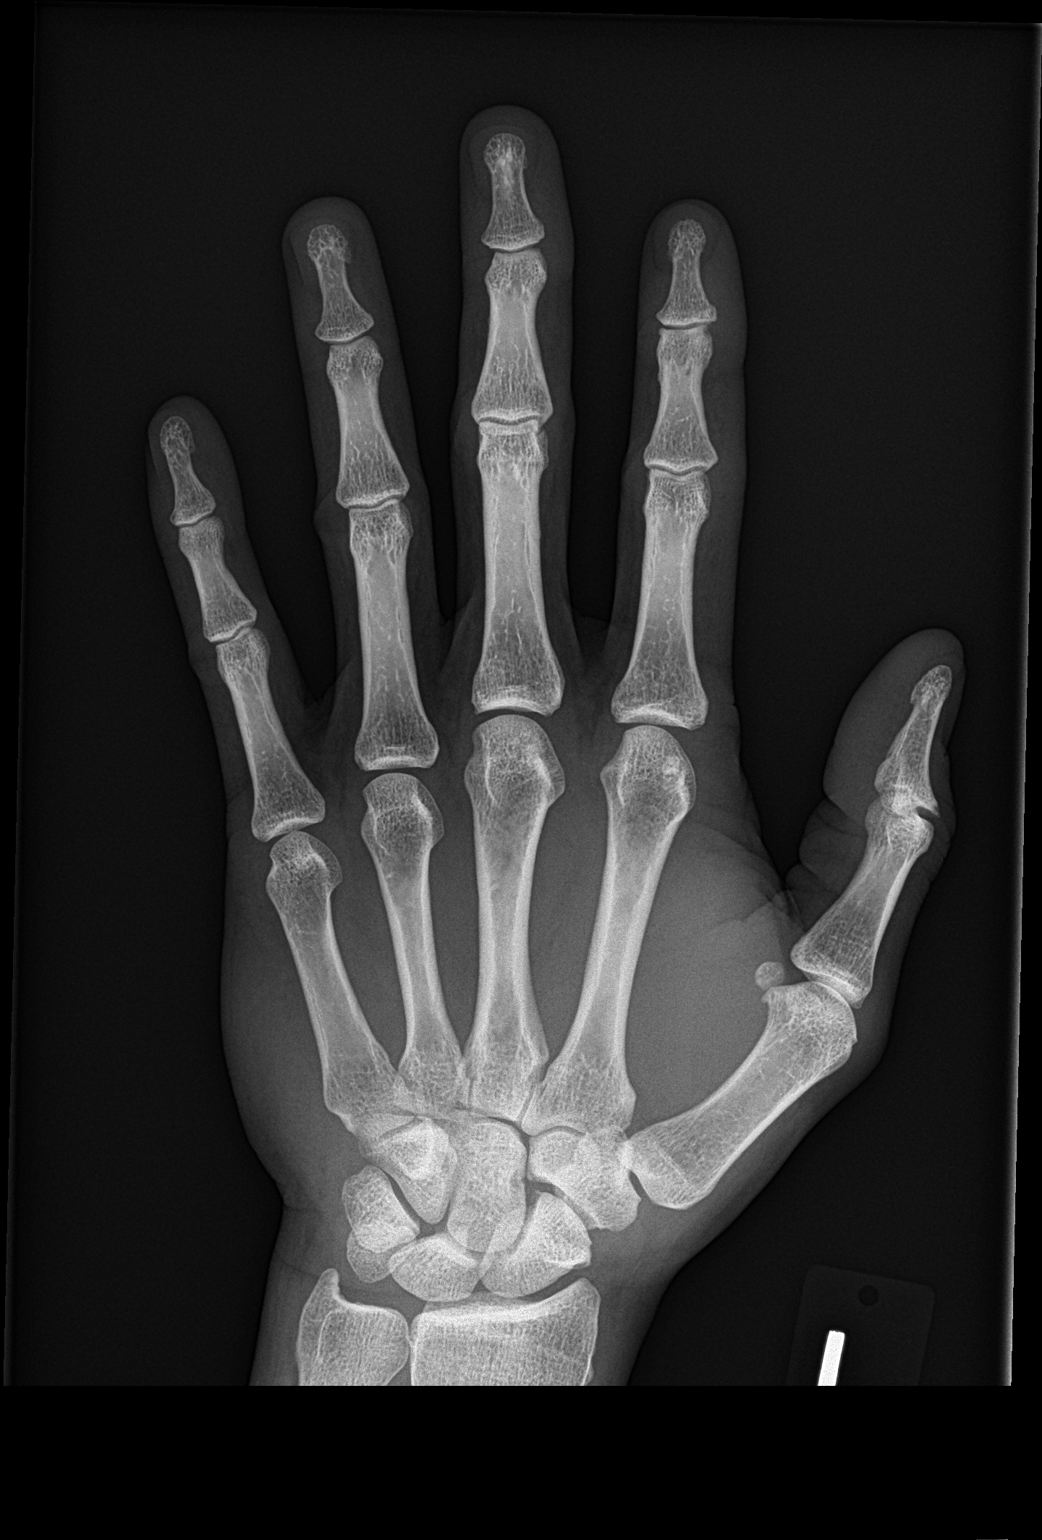

[hand obl]
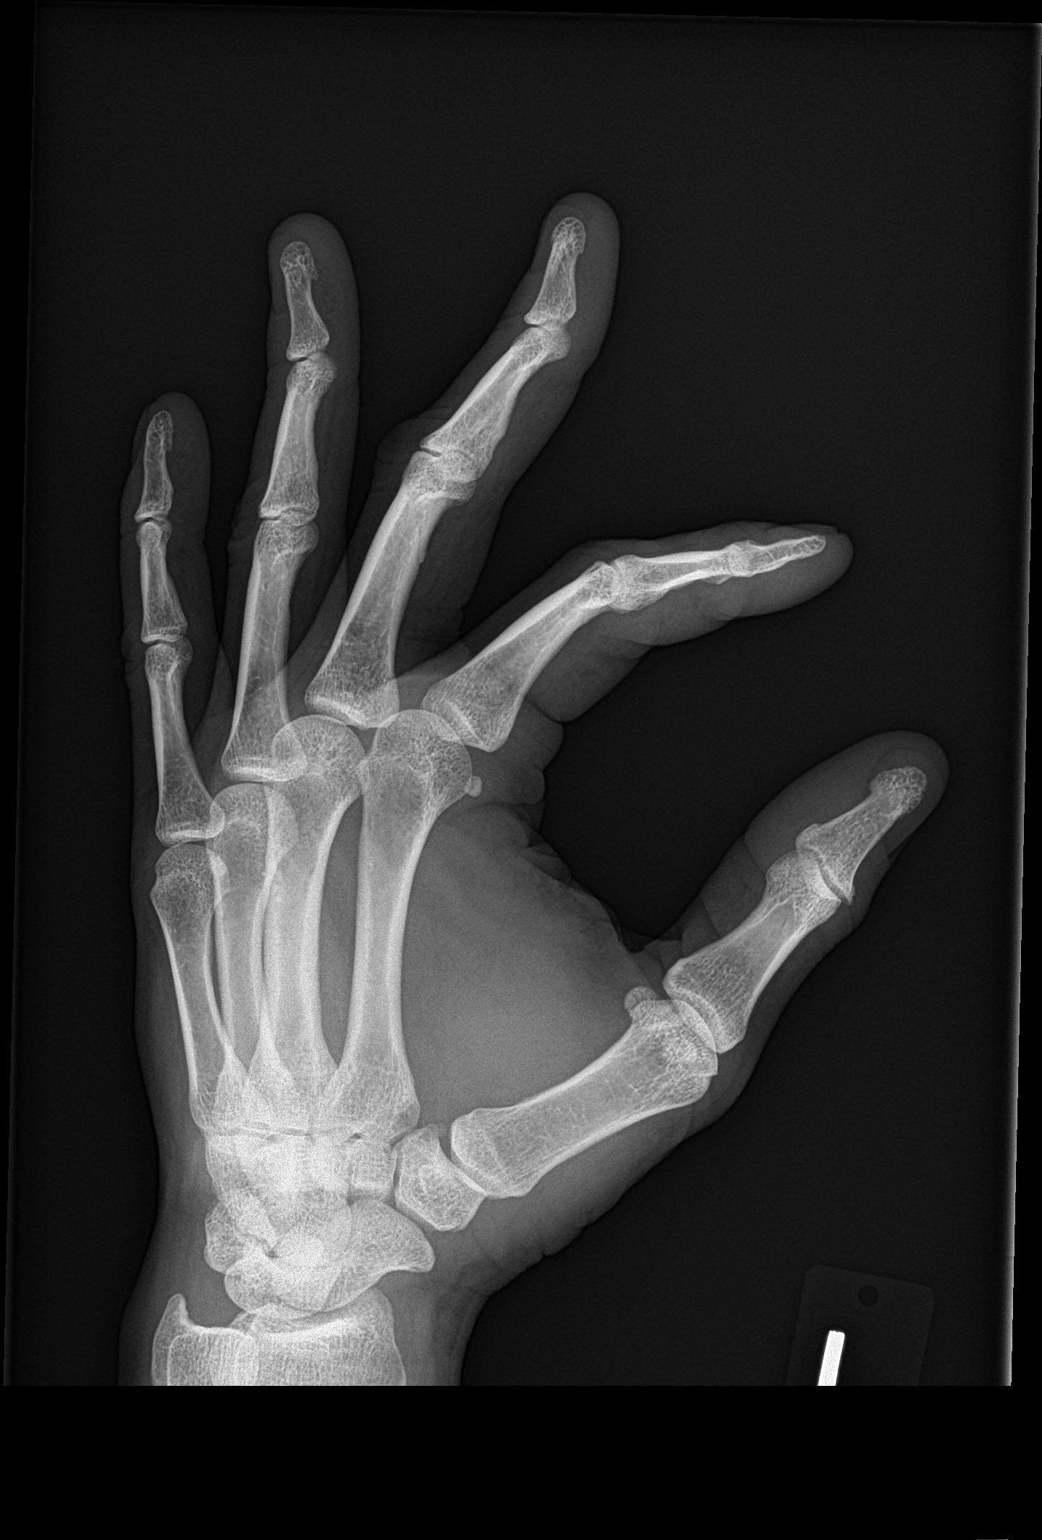

[hand lat]
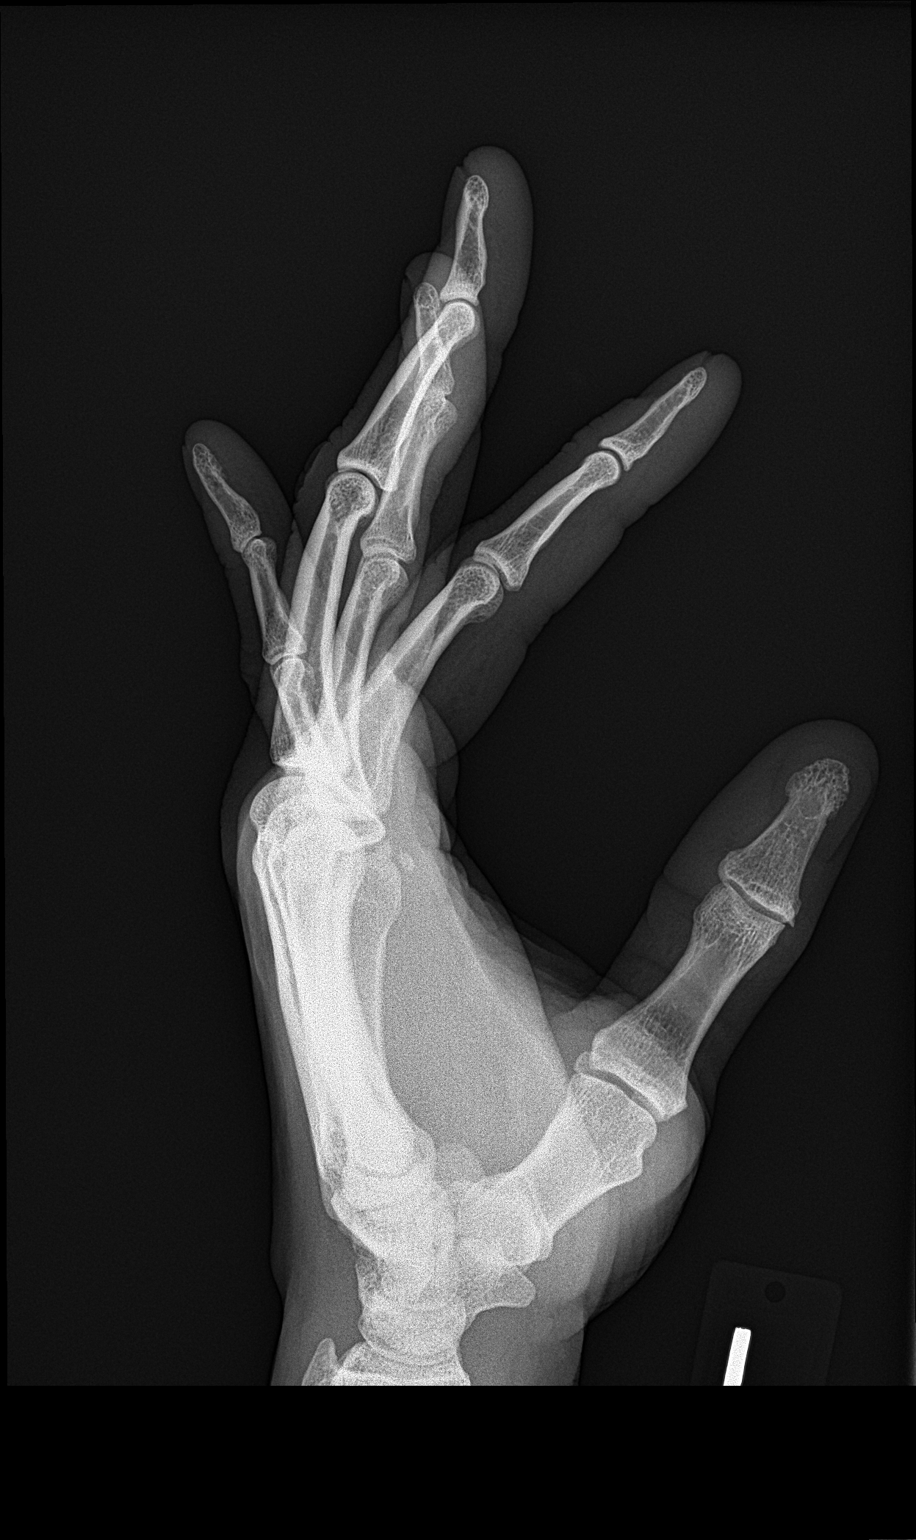

[3 of 3 positions shown; findings below may reference images not displayed]

FINDINGS: No acute bony or joint abnormality identified. No evidence of
fracture dislocation. No focal soft tissue abnormality identified.
No radiopaque foreign body
IMPRESSION: No acute or focal abnormality.

## 2017-09-04 NOTE — Progress Notes (Signed)
   MRN: [id number] DOB: 09/19/1980  Subjective:   Keith English is a 37 y.o. male presenting for 2 month history of growing mass over his left hand. Denies pain but feels that it is throbbing at times. He also has a mass over ulnar side of right 2nd DIP joint. Denies fever, weight loss, skin changes.  Keith English has a current medication list which includes the following prescription(s): amlodipine, lisinopril, lisinopril, and multivitamin. Also has No Known Allergies.  Keith English  has a past medical history of Hypertension. Also  has a past surgical history that includes Ankle surgery (02/19/2005); Hip surgery; Hip surgery (02/19/2005); and ORIF finger fracture (02/19/2005).  Objective:   Vitals: BP (!) 147/88   Pulse 93   Temp 98.8 F (37.1 C) (Oral)   Resp 17   Ht 6' 3.5" (1.918 m)   Wt 288 lb (130.6 kg)   SpO2 98%   BMI 35.52 kg/m   Physical Exam  Constitutional: He is oriented to person, place, and time. He appears well-developed and well-nourished.  Cardiovascular: Normal rate.  Pulmonary/Chest: Effort normal.  Musculoskeletal:       Left hand: He exhibits normal range of motion, no tenderness, no bony tenderness, normal capillary refill, no deformity, no laceration and no swelling. Normal sensation noted. Normal strength noted.       Hands: Neurological: He is alert and oriented to person, place, and time.   Dg Hand Complete Left  Result Date: 09/04/2017 CLINICAL DATA:  Mass left hand. EXAM: LEFT HAND - COMPLETE 3+ VIEW COMPARISON:  No recent prior. FINDINGS: No acute bony or joint abnormality identified. No evidence of fracture dislocation. No focal soft tissue abnormality identified. No radiopaque foreign body IMPRESSION: No acute or focal abnormality. Electronically Signed   By: Maisie Fushomas  Register   On: 09/04/2017 09:44   Dg Finger Index Right  Result Date: 09/04/2017 CLINICAL DATA:  Mass right ring finger. EXAM: RIGHT INDEX FINGER 2+V COMPARISON:  No recent prior. FINDINGS:  Corticated bony densities noted adjacent to the distal interphalangeal joint of the right second digit. This most likely a site of old fracture and/or degenerative change. No acute abnormality. No radiopaque foreign body. IMPRESSION: Corticated bony density noted adjacent to the distal interphalangeal joint of the right second digit. This is most likely an old fracture fragment and/or degenerative change. No acute abnormality. Electronically Signed   By: Maisie Fushomas  Register   On: 09/04/2017 09:46   Assessment and Plan :   Mass of left hand - Plan: DG Hand Complete Left, Ambulatory referral to Orthopedic Surgery  Mass of right finger - Plan: DG Finger Index Right, Ambulatory referral to Orthopedic Surgery  Will refer to ortho for further imaging and a consult on his hand masses. Return-to-clinic precautions discussed, patient verbalized understanding.   Wallis BambergMario Dresean Beckel, PA-C [facility] [phone] 09/04/2017  9:10 AM

## 2017-09-04 NOTE — Patient Instructions (Signed)
     IF you received an x-ray today, you will receive an invoice from [facility]. Please contact [facility] at [phone] with questions or concerns regarding your invoice.   IF you received labwork today, you will receive an invoice from [facility]. Please contact [facility] at [phone] with questions or concerns regarding your invoice.   Our billing staff will not be able to assist you with questions regarding bills from these companies.  You will be contacted with the lab results as soon as they are available. The fastest way to get your results is to activate your My Chart account. Instructions are located on the last page of this paperwork. If you have not heard from us regarding the results in 2 weeks, please contact this office.     

## 2017-09-18 ENCOUNTER — Ambulatory Visit (INDEPENDENT_AMBULATORY_CARE_PROVIDER_SITE_OTHER): Payer: BLUE CROSS/BLUE SHIELD | Admitting: Orthopaedic Surgery

## 2017-09-18 ENCOUNTER — Other Ambulatory Visit: Payer: Self-pay | Admitting: Physician Assistant

## 2017-09-18 DIAGNOSIS — I10 Essential (primary) hypertension: Secondary | ICD-10-CM

## 2017-09-18 NOTE — Telephone Encounter (Signed)
No further refills until pt makes appt for OV. Pt has already had a 15 day refill. 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 Called pt to make OV for BP f/u and left message to refill.  lisinopril refill Last OV: 09/03/16 LM to make OV Last Refill:07/22/17 for 30 days was advised to pt to make a OV for BP management Pharmacy:[facility] [address]  PCP: Benjiman CoreBrittany Wiseman PA

## 2017-09-18 NOTE — Telephone Encounter (Signed)
Will route to office for provider review. 

## 2017-09-22 ENCOUNTER — Ambulatory Visit (INDEPENDENT_AMBULATORY_CARE_PROVIDER_SITE_OTHER): Payer: BLUE CROSS/BLUE SHIELD | Admitting: Orthopaedic Surgery

## 2017-09-22 ENCOUNTER — Encounter (INDEPENDENT_AMBULATORY_CARE_PROVIDER_SITE_OTHER): Payer: Self-pay | Admitting: Orthopaedic Surgery

## 2017-09-22 DIAGNOSIS — M67432 Ganglion, left wrist: Secondary | ICD-10-CM | POA: Diagnosis not present

## 2017-09-22 DIAGNOSIS — M79644 Pain in right finger(s): Secondary | ICD-10-CM | POA: Diagnosis not present
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
## 2017-10-01 ENCOUNTER — Telehealth: Payer: Self-pay | Admitting: Urgent Care

## 2017-10-01 NOTE — Telephone Encounter (Signed)
Copied from CRM [id number]. Topic: Quick Communication - See Telephone Encounter >> Oct 01, 2017  6:46 PM Trula SladeWalter, Linda F wrote: CRM for notification. See Telephone encounter for: 10/01/17. Patient stated he had a DOT Physical on 08/29/17 by Wallis BambergMario Mani and he received in mail today [facility] [employer], [address] [place], [place], [place] [address] saying they did not receive the paperwork for his DOT Physical and they are going to drop him from a Class A to a Class C.  He wants to know if the provider sent the paperwork to the proper facility.  Please advise because he only has two weeks to straighten this out.

## 2017-10-02 NOTE — Telephone Encounter (Signed)
Left detailed VM for patient advising that our policy requires a follow-up visit for this medication which was due at the latest by last September. Pt will need to be seen in our office to have this medication refilled.

## 2017-12-01 ENCOUNTER — Ambulatory Visit: Payer: BLUE CROSS/BLUE SHIELD | Admitting: Physician Assistant

## 2017-12-01 ENCOUNTER — Encounter: Payer: Self-pay | Admitting: Physician Assistant

## 2017-12-01 ENCOUNTER — Other Ambulatory Visit: Payer: Self-pay

## 2017-12-01 VITALS — BP 138/82 | HR 98 | Temp 98.0°F | Resp 16 | Ht 75.2 in | Wt 280.0 lb

## 2017-12-01 DIAGNOSIS — Z6834 Body mass index (BMI) 34.0-34.9, adult: Secondary | ICD-10-CM | POA: Diagnosis not present

## 2017-12-01 DIAGNOSIS — I10 Essential (primary) hypertension: Secondary | ICD-10-CM | POA: Diagnosis not present

## 2017-12-01 DIAGNOSIS — E669 Obesity, unspecified: Secondary | ICD-10-CM

## 2017-12-01 MED ORDER — AMLODIPINE BESYLATE 10 MG PO TABS: ORAL_TABLET | ORAL | 1 refills | Status: DC

## 2017-12-01 MED ORDER — LISINOPRIL 20 MG PO TABS: 20.0000 mg | ORAL_TABLET | Freq: Every day | ORAL | 1 refills | Status: DC

## 2017-12-01 NOTE — Patient Instructions (Addendum)
In terms of blood pressure, I have given your refills for both medication. Start with amlodipine 10mg  daily for the next 2 weeks, monitor your blood pressure. Goal is 130/80. If still >130/80 with amlodipine, add lisinopril daily after two weeks. Continue checking blood pressure at least 3 times a week. Goal is <130/80 and >100/60. If below the lower range, stop bp medications and seek care immediately. If you start to have chest pain, blurred vision, shortness of breath, severe headache, lower leg swelling, or nausea/vomiting please seek care immediately here or at the ED.   Return here in 6 months for reevaluation. Bring home bp cuff to next visit.   Thank you for letting me participate in your health and well being.    How to Take Your Blood Pressure You can take your blood pressure at home with a machine. You may need to check your blood pressure at home:  To check if you have high blood pressure (hypertension).  To check your blood pressure over time.  To make sure your blood pressure medicine is working.  Supplies needed: You will need a blood pressure machine, or monitor. You can buy one at a drugstore or online. When choosing one:  Choose one with an arm cuff.  Choose one that wraps around your upper arm. Only one finger should fit between your arm and the cuff.  Do not choose one that measures your blood pressure from your wrist or finger.  Your doctor can suggest a monitor. How to prepare Avoid these things for 30 minutes before checking your blood pressure:  Drinking caffeine.  Drinking alcohol.  Eating.  Smoking.  Exercising.  Five minutes before checking your blood pressure:  Pee.  Sit in a dining chair. Avoid sitting in a soft couch or armchair.  Be quiet. Do not talk.  How to take your blood pressure Follow the instructions that came with your machine. If you have a digital blood pressure monitor, these may be the instructions: 1. Sit up  straight. 2. Place your feet on the floor. Do not cross your ankles or legs. 3. Rest your left arm at the level of your heart. You may rest it on a table, desk, or chair. 4. Pull up your shirt sleeve. 5. Wrap the blood pressure cuff around the upper part of your left arm. The cuff should be 1 inch (2.5 cm) above your elbow. It is best to wrap the cuff around bare skin. 6. Fit the cuff snugly around your arm. You should be able to place only one finger between the cuff and your arm. 7. Put the cord inside the groove of your elbow. 8. Press the power button. 9. Sit quietly while the cuff fills with air and loses air. 10. Write down the numbers on the screen. 11. Wait 2-3 minutes and then repeat steps 1-10.  What do the numbers mean? Two numbers make up your blood pressure. The first number is called systolic pressure. The second is called diastolic pressure. An example of a blood pressure reading is "120 over 80" (or 120/80). If you are an adult and do not have a medical condition, use this guide to find out if your blood pressure is normal: Normal  First number: below 120.  Second number: below 80. Elevated  First number: 120-129.  Second number: below 80. Hypertension stage 1  First number: 130-139.  Second number: 80-89. Hypertension stage 2  First number: 140 or above.  Second number: 90 or above. Your  blood pressure is above normal even if only the top or bottom number is above normal. Follow these instructions at home:  Check your blood pressure as often as your doctor tells you to.  Take your monitor to your next doctor's appointment. Your doctor will: ? Make sure you are using it correctly. ? Make sure it is working right.  Make sure you understand what your blood pressure numbers should be.  Tell your doctor if your medicines are causing side effects. Contact a doctor if:  Your blood pressure keeps being high. Get help right away if:  Your first blood  pressure number is higher than 180.  Your second blood pressure number is higher than 120. This information is not intended to replace advice given to you by your health care provider. Make sure you discuss any questions you have with your health care provider. Document Released: 05/15/2008 Document Revised: 04/30/2016 Document Reviewed: 11/09/2015 Elsevier Interactive Patient Education  2018 [employer]    DASH Eating Plan DASH stands for "Dietary Approaches to Stop Hypertension." The DASH eating plan is a healthy eating plan that has been shown to reduce high blood pressure (hypertension). It may also reduce your risk for type 2 diabetes, heart disease, and stroke. The DASH eating plan may also help with weight loss. What are tips for following this plan? General guidelines  Avoid eating more than 2,300 mg (milligrams) of salt (sodium) a day. If you have hypertension, you may need to reduce your sodium intake to 1,500 mg a day.  Limit alcohol intake to no more than 1 drink a day for nonpregnant women and 2 drinks a day for men. One drink equals 12 oz of beer, 5 oz of wine, or 1 oz of hard liquor.  Work with your health care provider to maintain a healthy body weight or to lose weight. Ask what an ideal weight is for you.  Get at least 30 minutes of exercise that causes your heart to beat faster (aerobic exercise) most days of the week. Activities may include walking, swimming, or biking.  Work with your health care provider or diet and nutrition specialist (dietitian) to adjust your eating plan to your individual calorie needs. Reading food labels  Check food labels for the amount of sodium per serving. Choose foods with less than 5 percent of the Daily Value of sodium. Generally, foods with less than 300 mg of sodium per serving fit into this eating plan.  To find whole grains, look for the word "whole" as the first word in the ingredient list. Shopping  Buy products labeled as  "low-sodium" or "no salt added."  Buy fresh foods. Avoid canned foods and premade or frozen meals. Cooking  Avoid adding salt when cooking. Use salt-free seasonings or herbs instead of table salt or sea salt. Check with your health care provider or pharmacist before using salt substitutes.  Do not fry foods. Cook foods using healthy methods such as baking, boiling, grilling, and broiling instead.  Cook with heart-healthy oils, such as olive, canola, soybean, or sunflower oil. Meal planning   Eat a balanced diet that includes: ? 5 or more servings of fruits and vegetables each day. At each meal, try to fill half of your plate with fruits and vegetables. ? Up to 6-8 servings of whole grains each day. ? Less than 6 oz of lean meat, poultry, or fish each day. A 3-oz serving of meat is about the same size as a deck of cards. One  egg equals 1 oz. ? 2 servings of low-fat dairy each day. ? A serving of nuts, seeds, or beans 5 times each week. ? Heart-healthy fats. Healthy fats called Omega-3 fatty acids are found in foods such as flaxseeds and coldwater fish, like sardines, salmon, and mackerel.  Limit how much you eat of the following: ? Canned or prepackaged foods. ? Food that is high in trans fat, such as fried foods. ? Food that is high in saturated fat, such as fatty meat. ? Sweets, desserts, sugary drinks, and other foods with added sugar. ? Full-fat dairy products.  Do not salt foods before eating.  Try to eat at least 2 vegetarian meals each week.  Eat more home-cooked food and less restaurant, buffet, and fast food.  When eating at a restaurant, ask that your food be prepared with less salt or no salt, if possible. What foods are recommended? The items listed may not be a complete list. Talk with your dietitian about what dietary choices are best for you. Grains Whole-grain or whole-wheat bread. Whole-grain or whole-wheat pasta. Brown rice. Orpah Cobb. Bulgur. Whole-grain  and low-sodium cereals. Pita bread. Low-fat, low-sodium crackers. Whole-wheat flour tortillas. Vegetables Fresh or frozen vegetables (raw, steamed, roasted, or grilled). Low-sodium or reduced-sodium tomato and vegetable juice. Low-sodium or reduced-sodium tomato sauce and tomato paste. Low-sodium or reduced-sodium canned vegetables. Fruits All fresh, dried, or frozen fruit. Canned fruit in natural juice (without added sugar). Meat and other protein foods Skinless chicken or [place]. Ground chicken or [place]. Pork with fat trimmed off. Fish and seafood. Egg whites. Dried beans, peas, or lentils. Unsalted nuts, nut butters, and seeds. Unsalted canned beans. Lean cuts of beef with fat trimmed off. Low-sodium, lean deli meat. Dairy Low-fat (1%) or fat-free (skim) milk. Fat-free, low-fat, or reduced-fat cheeses. Nonfat, low-sodium ricotta or cottage cheese. Low-fat or nonfat yogurt. Low-fat, low-sodium cheese. Fats and oils Soft margarine without trans fats. Vegetable oil. Low-fat, reduced-fat, or light mayonnaise and salad dressings (reduced-sodium). Canola, safflower, olive, soybean, and sunflower oils. Avocado. Seasoning and other foods Herbs. Spices. Seasoning mixes without salt. Unsalted popcorn and pretzels. Fat-free sweets. What foods are not recommended? The items listed may not be a complete list. Talk with your dietitian about what dietary choices are best for you. Grains Baked goods made with fat, such as croissants, muffins, or some breads. Dry pasta or rice meal packs. Vegetables Creamed or fried vegetables. Vegetables in a cheese sauce. Regular canned vegetables (not low-sodium or reduced-sodium). Regular canned tomato sauce and paste (not low-sodium or reduced-sodium). Regular tomato and vegetable juice (not low-sodium or reduced-sodium). Rosita Fire. Olives. Fruits Canned fruit in a light or heavy syrup. Fried fruit. Fruit in cream or butter sauce. Meat and other protein foods Fatty cuts  of meat. Ribs. Fried meat. Tomasa Blase. Sausage. Bologna and other processed lunch meats. Salami. Fatback. Hotdogs. Bratwurst. Salted nuts and seeds. Canned beans with added salt. Canned or smoked fish. Whole eggs or egg yolks. Chicken or [place] with skin. Dairy Whole or 2% milk, cream, and half-and-half. Whole or full-fat cream cheese. Whole-fat or sweetened yogurt. Full-fat cheese. Nondairy creamers. Whipped toppings. Processed cheese and cheese spreads. Fats and oils Butter. Stick margarine. Lard. Shortening. Ghee. Bacon fat. Tropical oils, such as coconut, palm kernel, or palm oil. Seasoning and other foods Salted popcorn and pretzels. Onion salt, garlic salt, seasoned salt, table salt, and sea salt. Worcestershire sauce. Tartar sauce. Barbecue sauce. Teriyaki sauce. Soy sauce, including reduced-sodium. Steak sauce. Canned and packaged gravies. Fish  sauce. Oyster sauce. Cocktail sauce. Horseradish that you find on the shelf. Ketchup. Mustard. Meat flavorings and tenderizers. Bouillon cubes. Hot sauce and Tabasco sauce. Premade or packaged marinades. Premade or packaged taco seasonings. Relishes. Regular salad dressings. Where to find more information:  National Heart, Lung, and Blood Institute: PopSteam.is  American Heart Association: www.heart.org Summary  The DASH eating plan is a healthy eating plan that has been shown to reduce high blood pressure (hypertension). It may also reduce your risk for type 2 diabetes, heart disease, and stroke.  With the DASH eating plan, you should limit salt (sodium) intake to 2,300 mg a day. If you have hypertension, you may need to reduce your sodium intake to 1,500 mg a day.  When on the DASH eating plan, aim to eat more fresh fruits and vegetables, whole grains, lean proteins, low-fat dairy, and heart-healthy fats.  Work with your health care provider or diet and nutrition specialist (dietitian) to adjust your eating plan to your individual calorie  needs. This information is not intended to replace advice given to you by your health care provider. Make sure you discuss any questions you have with your health care provider. Document Released: 05/22/2011 Document Revised: 05/26/2016 Document Reviewed: 05/26/2016 Elsevier Interactive Patient Education  2018 [employer].  IF you received an x-ray today, you will receive an invoice from [facility]. Please contact [facility] at [phone] with questions or concerns regarding your invoice.   IF you received labwork today, you will receive an invoice from [facility]. Please contact [facility] at [phone] with questions or concerns regarding your invoice.   Our billing staff will not be able to assist you with questions regarding bills from these companies.  You will be contacted with the lab results as soon as they are available. The fastest way to get your results is to activate your My Chart account. Instructions are located on the last page of this paperwork. If you have not heard from [place] regarding the results in 2 weeks, please contact this office.

## 2017-12-01 NOTE — Progress Notes (Addendum)
MRN: [id number] DOB: 04/11/1981  Subjective:   Keith English is a 37 y.o. male presenting for follow up on Hypertension. Was managed on lisinopril 27m and amlodipine 132mdaily.  Has been out of medication for about 2 months.Patient is checking blood pressure at home, range is higher 14169Cystolic since being out of medication. While on medication it was 13789Fystolically.  Reports he hasn't really paid attention to any symptoms. . Denies lightheadedness, dizziness, chronic headache, double vision, chest pain, shortness of breath, heart racing, palpitations, nausea, vomiting, abdominal pain, hematuria, lower leg swelling. Lifestyle: Avoiding excessive salt intake.  Diet: tuna, lettuce, zuchinni, squash, and squash. Drinks mostly soda and water. Walks daily. Has lost weight since last year.  Denies smoking. Drinks alcohol occasionally. Denies any other aggravating or relieving factors, no other questions or concerns.  LaKhanhas a current medication list which includes the following prescription(s): amlodipine, lisinopril, and multivitamin. Also has No Known Allergies.  LaZephyrhas a past medical history of Hypertension. Also  has a past surgical history that includes Ankle surgery (02/19/2005); Hip surgery; Hip surgery (02/19/2005); and ORIF finger fracture (02/19/2005).    Social History   Socioeconomic History  . Marital status: Single    Spouse name: [name]  . Number of children: 2  . Years of education: 1312. Highest education level: Not on file  Occupational History  . 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 Financial resource strain: Not on file  . Food insecurity:    Worry: Not on file    Inability: Not on file  . Transportation needs:    Medical: Not on file    Non-medical: Not on file  Tobacco Use  . Smoking status: Never Smoker  . Smokeless tobacco: Never Used  Substance and Sexual Activity  . Alcohol  use: Yes    Alcohol/week: 0.6 oz    Types: 1 Glasses of wine per week  . Drug use: No  . Sexual activity: Yes    Partners: Female    Comment: 4 in 12 months  Lifestyle  . Physical activity:    Days per week: Not on file    Minutes per session: Not on file  . Stress: Not on file  Relationships  . Social connections:    Talks on phone: Not on file    Gets together: Not on file    Attends religious service: Not on file    Active member of club or organization: Not on file    Attends meetings of clubs or organizations: Not on file    Relationship status: Not on file  . Intimate partner violence:    Fear of current or ex partner: Not on file    Emotionally abused: Not on file    Physically abused: Not on file    Forced sexual activity: Not on file  Other Topics Concern  . Not on file  Social History Narrative   Engaged to be married 03/13/2012 to the mother of his 2 children.    Objective:   Vitals: BP 138/82   Pulse 98   Temp 98 F (36.7 C) (Oral)   Resp 16   Ht 6' 3.2" (1.91 m)   Wt 280 lb (127 kg)   SpO2 98%   BMI 34.81 kg/m   Physical Exam  Constitutional: He is oriented to person, place, and time. He appears well-developed and well-nourished.  No distress.  HENT:  Head: Normocephalic and atraumatic.  Mouth/Throat: Uvula is midline, oropharynx is clear and moist and mucous membranes are normal. No tonsillar exudate.  Eyes: Pupils are equal, round, and reactive to light. Conjunctivae and EOM are normal.  Neck: Normal range of motion.  Cardiovascular: Normal rate, regular rhythm, normal heart sounds and intact distal pulses.  Pulmonary/Chest: Effort normal and breath sounds normal. He has no decreased breath sounds. He has no wheezes. He has no rhonchi. He has no rales.  Musculoskeletal:       Right lower leg: He exhibits no swelling.       Left lower leg: He exhibits no swelling.  Neurological: He is alert and oriented to person, place, and time.  Skin: Skin is  warm and dry.  Psychiatric: He has a normal mood and affect.  Vitals reviewed.    BP Readings from Last 3 Encounters:  12/01/17 138/82  09/04/17 (!) 147/88  08/29/17 128/78   Wt Readings from Last 3 Encounters:  12/01/17 280 lb (127 kg)  09/04/17 288 lb (130.6 kg)  08/29/17 284 lb (128.8 kg)    No results found for this or any previous visit (from the past 24 hour(s)).  Assessment and Plan :  1. Essential hypertension 2. Class 1 obesity with serious comorbidity and body mass index (BMI) of 34.0 to 34.9 in adult, unspecified obesity type Labs pending. BP uncontrolled at 138/82. Goal is <130/80. Recommend he restart amlodipine 75m daily only for the next 2 weeks and f/u for repeat BP to see if he needs 2 agents vs 1 agent since recent wt loss.  Encouraged pt to bring home bp monitor to next visit for in office comparison. Congratulated on wt loss. Encouraged to continue lifestyle modifications.  - CBC with Differential/Platelet - CMP14+EGFR - TSH - Urinalysis Dipstick - amLODipine (NORVASC) 10 MG tablet; 1 tablet daily  Dispense: 90 tablet; Refill: 1 - lisinopril (PRINIVIL,ZESTRIL) 20 MG tablet; Take 1 tablet (20 mg total) by mouth daily.  Dispense: 90 tablet; Refill: 1  Side effects, risks, benefits, and alternatives of the medications and treatment plan prescribed today were discussed, and patient expressed understanding of the instructions given. No barriers to understanding were identified. Red flags discussed in detail. Pt expressed understanding regarding what to do in case of emergency/urgent symptoms.  BTenna Delaine PA-C  [facility] 12/01/2017 1:39 PM

## 2017-12-02 LAB — URINALYSIS, DIPSTICK ONLY
Bilirubin, UA: NEGATIVE
GLUCOSE, UA: NEGATIVE
Ketones, UA: NEGATIVE
LEUKOCYTES UA: NEGATIVE
Nitrite, UA: NEGATIVE
PH UA: 5.5 (ref 5.0–7.5)
PROTEIN UA: NEGATIVE
RBC, UA: NEGATIVE
SPEC GRAV UA: 1.026 (ref 1.005–1.030)
Urobilinogen, Ur: 1 mg/dL (ref 0.2–1.0)

## 2017-12-02 LAB — CBC WITH DIFFERENTIAL/PLATELET
Basophils Absolute: 0 10*3/uL (ref 0.0–0.2)
Basos: 0 %
EOS (ABSOLUTE): 0.1 10*3/uL (ref 0.0–0.4)
EOS: 1 %
HEMOGLOBIN: 14.3 g/dL (ref 13.0–17.7)
Hematocrit: 43.8 % (ref 37.5–51.0)
IMMATURE GRANS (ABS): 0 10*3/uL (ref 0.0–0.1)
Immature Granulocytes: 0 %
LYMPHS ABS: 1.8 10*3/uL (ref 0.7–3.1)
LYMPHS: 51 %
MCH: 28.4 pg (ref 26.6–33.0)
MCHC: 32.6 g/dL (ref 31.5–35.7)
MCV: 87 fL (ref 79–97)
MONOCYTES: 8 %
Monocytes Absolute: 0.3 10*3/uL (ref 0.1–0.9)
NEUTROS ABS: 1.5 10*3/uL (ref 1.4–7.0)
Neutrophils: 40 %
Platelets: 212 10*3/uL (ref 150–450)
RBC: 5.03 x10E6/uL (ref 4.14–5.80)
RDW: 13 % (ref 12.3–15.4)
WBC: 3.7 10*3/uL (ref 3.4–10.8)

## 2017-12-02 LAB — CMP14+EGFR
A/G RATIO: 1.7 (ref 1.2–2.2)
ALBUMIN: 4.6 g/dL (ref 3.5–5.5)
ALT: 24 IU/L (ref 0–44)
AST: 23 IU/L (ref 0–40)
Alkaline Phosphatase: 56 IU/L (ref 39–117)
BILIRUBIN TOTAL: 0.9 mg/dL (ref 0.0–1.2)
BUN / CREAT RATIO: 10 (ref 9–20)
BUN: 9 mg/dL (ref 6–20)
CHLORIDE: 102 mmol/L (ref 96–106)
CO2: 22 mmol/L (ref 20–29)
Calcium: 9.4 mg/dL (ref 8.7–10.2)
Creatinine, Ser: 0.94 mg/dL (ref 0.76–1.27)
GFR calc non Af Amer: 103 mL/min/{1.73_m2} (ref >59)
GFR, EST AFRICAN AMERICAN: 119 mL/min/{1.73_m2} (ref >59)
GLOBULIN, TOTAL: 2.7 g/dL (ref 1.5–4.5)
GLUCOSE: 122 mg/dL — AB (ref 65–99)
Potassium: 4.1 mmol/L (ref 3.5–5.2)
SODIUM: 140 mmol/L (ref 134–144)
TOTAL PROTEIN: 7.3 g/dL (ref 6.0–8.5)

## 2017-12-02 LAB — TSH: TSH: 1.38 u[IU]/mL (ref 0.450–4.500)

## 2018-07-07 DIAGNOSIS — L908 Other atrophic disorders of skin: Secondary | ICD-10-CM | POA: Diagnosis not present

## 2018-07-07 DIAGNOSIS — N3281 Overactive bladder: Secondary | ICD-10-CM | POA: Diagnosis not present

## 2018-07-07 DIAGNOSIS — A6000 Herpesviral infection of urogenital system, unspecified: Secondary | ICD-10-CM | POA: Diagnosis not present

## 2018-10-19 DIAGNOSIS — Z113 Encounter for screening for infections with a predominantly sexual mode of transmission: Secondary | ICD-10-CM | POA: Diagnosis not present

## 2018-10-19 DIAGNOSIS — N341 Nonspecific urethritis: Secondary | ICD-10-CM | POA: Diagnosis not present

## 2018-10-19 DIAGNOSIS — Z118 Encounter for screening for other infectious and parasitic diseases: Secondary | ICD-10-CM | POA: Diagnosis not present

## 2019-01-26 DIAGNOSIS — Z20828 Contact with and (suspected) exposure to other viral communicable diseases: Secondary | ICD-10-CM | POA: Diagnosis not present

## 2019-03-23 DIAGNOSIS — N419 Inflammatory disease of prostate, unspecified: Secondary | ICD-10-CM | POA: Diagnosis not present

## 2019-08-03 ENCOUNTER — Emergency Department (HOSPITAL_COMMUNITY): Payer: BC Managed Care – PPO

## 2019-08-03 ENCOUNTER — Encounter (HOSPITAL_COMMUNITY): Payer: Self-pay | Admitting: Emergency Medicine

## 2019-08-03 ENCOUNTER — Other Ambulatory Visit: Payer: Self-pay

## 2019-08-03 ENCOUNTER — Emergency Department (HOSPITAL_COMMUNITY)
Admission: EM | Admit: 2019-08-03 | Discharge: 2019-08-03 | Disposition: A | Discharge status: Home / Self Care | Payer: BC Managed Care – PPO | Attending: Emergency Medicine | Admitting: Emergency Medicine

## 2019-08-03 DIAGNOSIS — I10 Essential (primary) hypertension: Secondary | ICD-10-CM | POA: Insufficient documentation

## 2019-08-03 DIAGNOSIS — M542 Cervicalgia: Secondary | ICD-10-CM

## 2019-08-03 DIAGNOSIS — M62838 Other muscle spasm: Secondary | ICD-10-CM

## 2019-08-03 IMAGING — DX DG CERVICAL SPINE COMPLETE 4+V
5 series · 5 of 5 positions shown · non-contrast
Comparison: [DATE]

CLINICAL DATA: Neck pain and stiffness. Pain radiates down the
right arm. No trauma history

EXAM:
CERVICAL SPINE - COMPLETE 4+ VIEW

[c-spine lat]
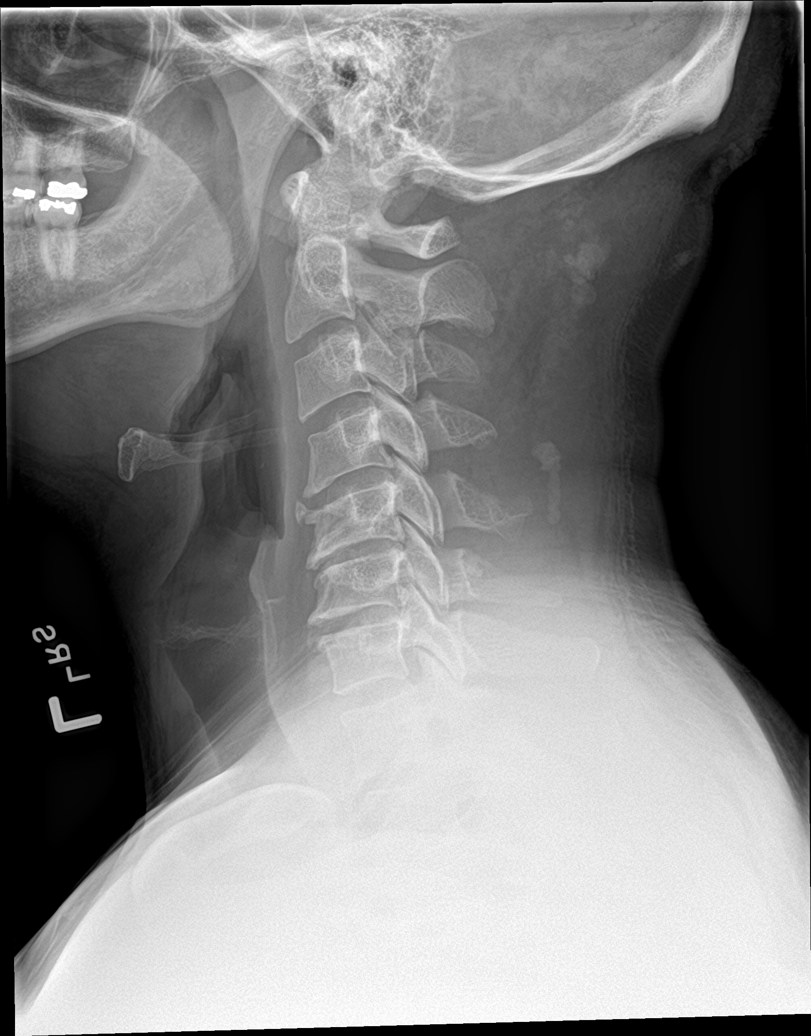

[c-spine obl (1 of 2)]
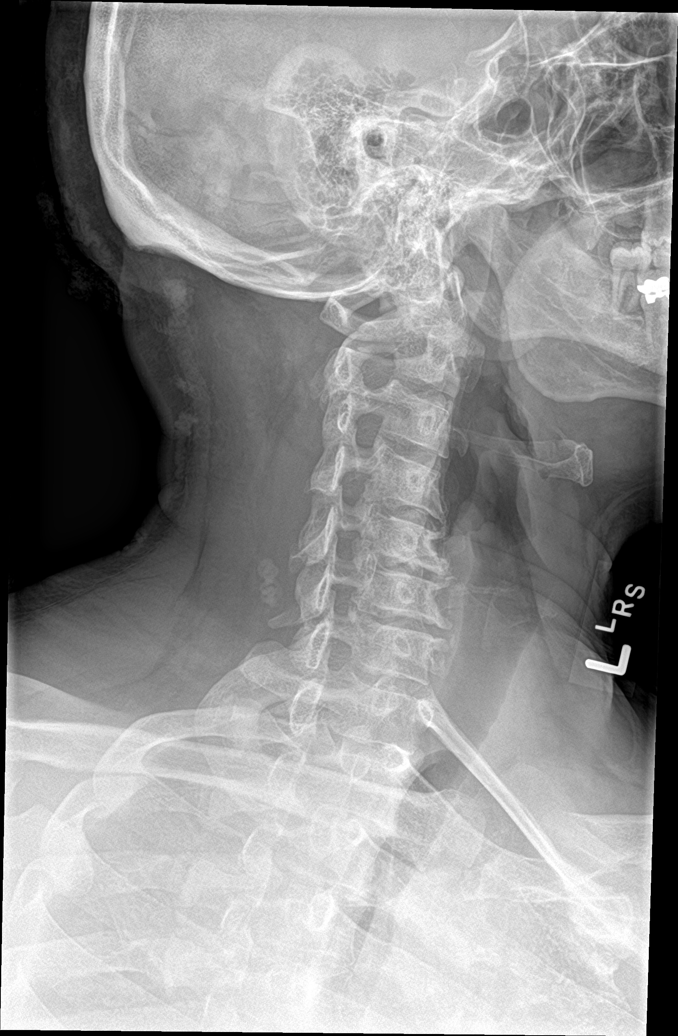

[c-spine obl (2 of 2)]
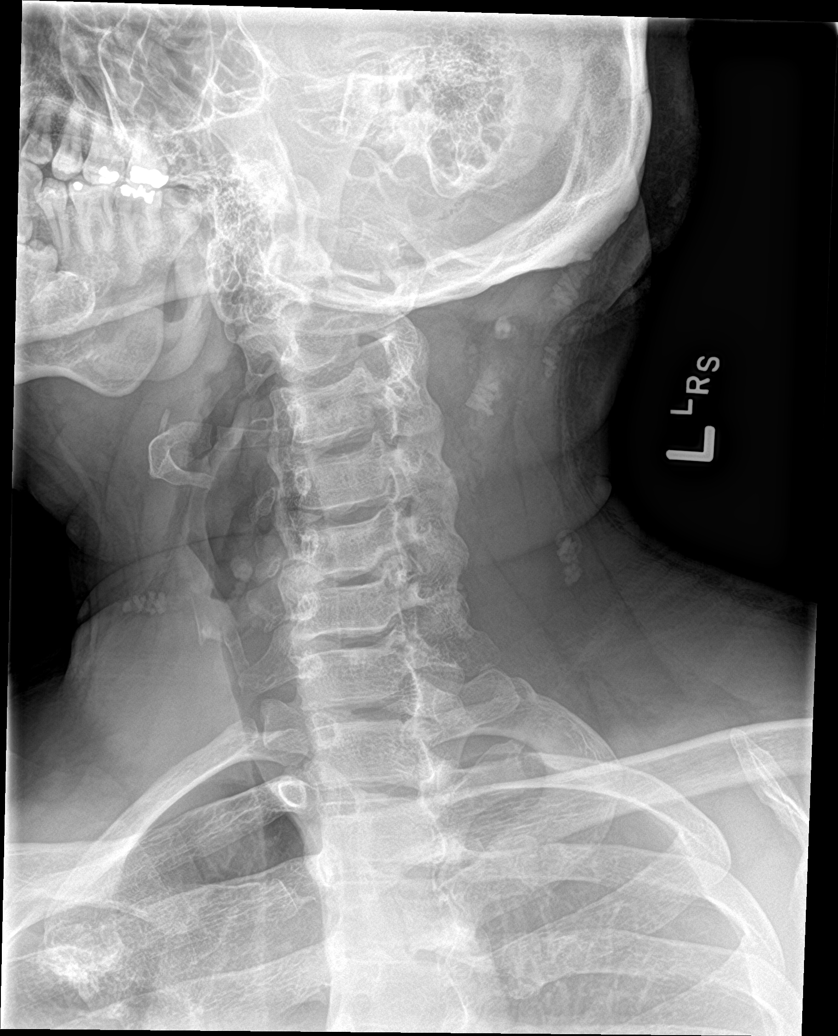

[c-spine ap]
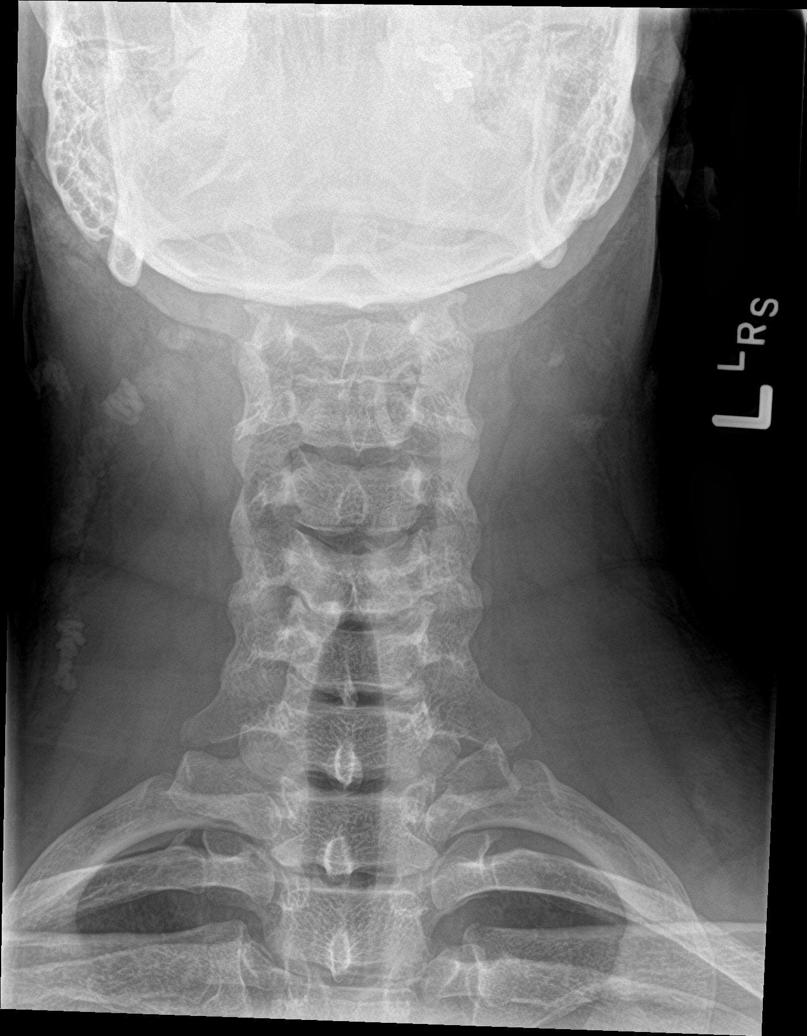

[c-spine open mouth]
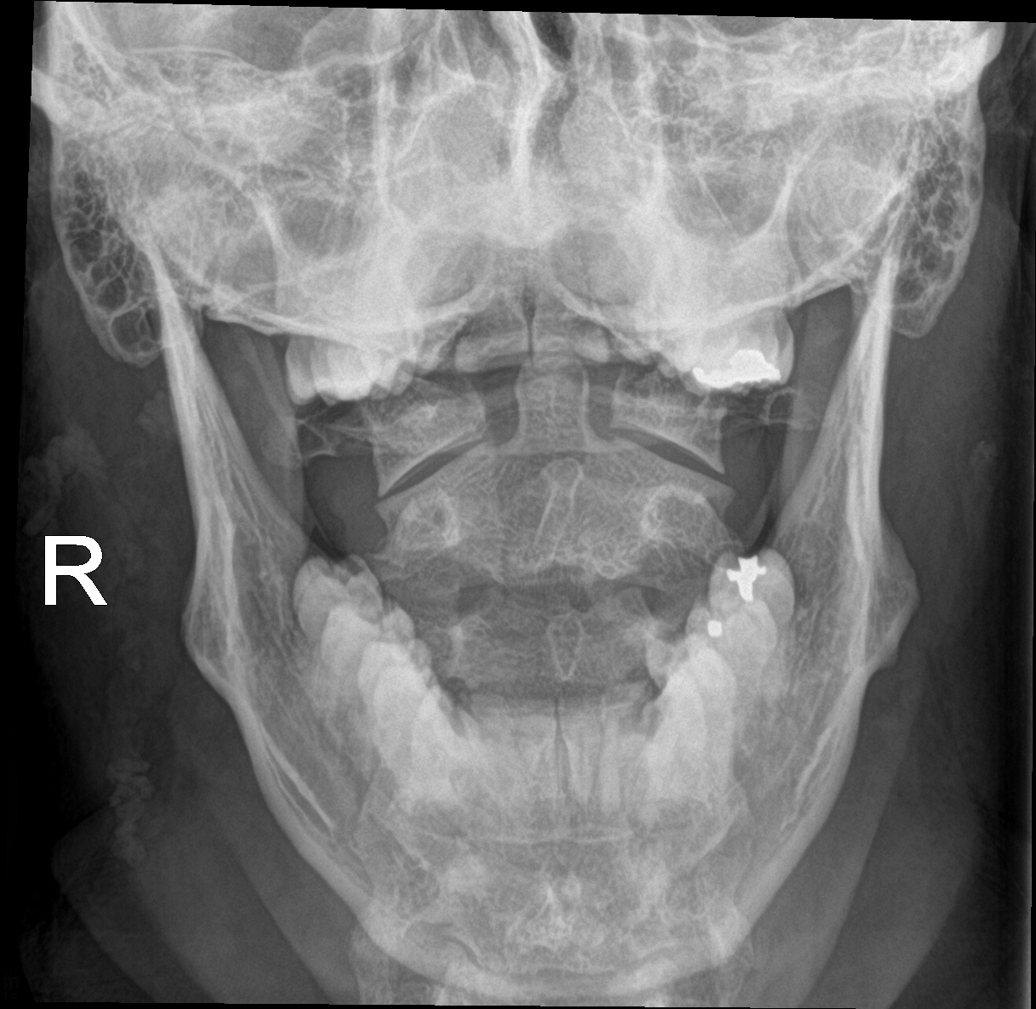

[5 of 5 positions shown; findings below may reference images not displayed]

FINDINGS: C4-5 to C6-7 disc narrowing with mainly ventral spurring. C2-3
possible facet spurring. Left oblique radiograph is suboptimal
visualizing the foramina. The symptomatic right side there is no
high-grade bony foraminal stenosis. No evidence of fracture or bone
lesion. Negative prevertebral soft tissues.
IMPRESSION: 1. No emergent finding.
2. Disc degeneration from C4-5 to C6-7.
3. On the symptomatic right side there is no high-grade bony
foraminal impingement.

## 2019-08-03 MED ORDER — CYCLOBENZAPRINE HCL 10 MG PO TABS: 10.0000 mg | ORAL_TABLET | Freq: Two times a day (BID) | ORAL | 0 refills | Status: DC | PRN

## 2019-08-03 MED ORDER — KETOROLAC TROMETHAMINE 30 MG/ML IJ SOLN
30.0000 mg | Freq: Once | INTRAMUSCULAR | Status: AC
  Administered 2019-08-03: 05:00:00 30 mg via INTRAMUSCULAR
  Filled 2019-08-03: qty 1

## 2019-08-03 MED ORDER — CYCLOBENZAPRINE HCL 10 MG PO TABS
5.0000 mg | ORAL_TABLET | Freq: Once | ORAL | Status: AC
  Administered 2019-08-03: 5 mg via ORAL
  Filled 2019-08-03: qty 1

## 2019-08-03 MED ORDER — NAPROXEN 500 MG PO TABS: 500.0000 mg | ORAL_TABLET | Freq: Two times a day (BID) | ORAL | 0 refills | Status: DC

## 2019-08-03 NOTE — Discharge Instructions (Addendum)
You were seen today for neck pain.  There appears to be spasm in your right trapezius.  Apply heat or ice as this helps.  Take naproxen twice daily.  You will also be given a prescription for Flexeril.  Do not drive while taking Flexeril.  If you develop numbness, tingling, weakness in the right hand, you should be reevaluated.
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
## 2019-08-03 NOTE — ED Triage Notes (Signed)
Patient reports " crick" at right lateral neck onset this week , denies injury , pain radiating down to right shoulder worse with movement /changing positions .

## 2019-10-03 ENCOUNTER — Ambulatory Visit: Payer: BC Managed Care – PPO | Admitting: Internal Medicine

## 2019-10-03 ENCOUNTER — Other Ambulatory Visit: Payer: Self-pay

## 2019-10-03 ENCOUNTER — Encounter: Payer: Self-pay | Admitting: Internal Medicine

## 2019-10-03 VITALS — BP 166/94 | HR 97 | Temp 97.2°F | Ht 74.0 in | Wt 299.0 lb

## 2019-10-03 DIAGNOSIS — J301 Allergic rhinitis due to pollen: Secondary | ICD-10-CM | POA: Insufficient documentation

## 2019-10-03 DIAGNOSIS — Z23 Encounter for immunization: Secondary | ICD-10-CM

## 2019-10-03 DIAGNOSIS — Z0000 Encounter for general adult medical examination without abnormal findings: Secondary | ICD-10-CM | POA: Diagnosis not present

## 2019-10-03 DIAGNOSIS — K219 Gastro-esophageal reflux disease without esophagitis: Secondary | ICD-10-CM | POA: Diagnosis not present

## 2019-10-03 DIAGNOSIS — I10 Essential (primary) hypertension: Secondary | ICD-10-CM

## 2019-10-03 MED ORDER — LISINOPRIL-HYDROCHLOROTHIAZIDE 20-12.5 MG PO TABS: 2.0000 | ORAL_TABLET | Freq: Every day | ORAL | 3 refills | Status: DC

## 2019-10-03 MED ORDER — TIZANIDINE HCL 4 MG PO TABS: 4.0000 mg | ORAL_TABLET | Freq: Two times a day (BID) | ORAL | 0 refills | Status: DC | PRN

## 2019-10-03 NOTE — Patient Instructions (Signed)
Please start the new blood pressure medication---lisinopril/HCTZ --with just one a day. If you are not having any problems with this (and you are not dizzy when you stand up) after 1 week, increase to 2 tabs daily.   DASH Eating Plan DASH stands for "Dietary Approaches to Stop Hypertension." The DASH eating plan is a healthy eating plan that has been shown to reduce high blood pressure (hypertension). It may also reduce your risk for type 2 diabetes, heart disease, and stroke. The DASH eating plan may also help with weight loss. What are tips for following this plan?  General guidelines  Avoid eating more than 2,300 mg (milligrams) of salt (sodium) a day. If you have hypertension, you may need to reduce your sodium intake to 1,500 mg a day.  Limit alcohol intake to no more than 1 drink a day for nonpregnant women and 2 drinks a day for men. One drink equals 12 oz of beer, 5 oz of wine, or 1 oz of hard liquor.  Work with your health care provider to maintain a healthy body weight or to lose weight. Ask what an ideal weight is for you.  Get at least 30 minutes of exercise that causes your heart to beat faster (aerobic exercise) most days of the week. Activities may include walking, swimming, or biking.  Work with your health care provider or diet and nutrition specialist (dietitian) to adjust your eating plan to your individual calorie needs. Reading food labels   Check food labels for the amount of sodium per serving. Choose foods with less than 5 percent of the Daily Value of sodium. Generally, foods with less than 300 mg of sodium per serving fit into this eating plan.  To find whole grains, look for the word "whole" as the first word in the ingredient list. Shopping  Buy products labeled as "low-sodium" or "no salt added."  Buy fresh foods. Avoid canned foods and premade or frozen meals. Cooking  Avoid adding salt when cooking. Use salt-free seasonings or herbs instead of table salt  or sea salt. Check with your health care provider or pharmacist before using salt substitutes.  Do not fry foods. Cook foods using healthy methods such as baking, boiling, grilling, and broiling instead.  Cook with heart-healthy oils, such as olive, canola, soybean, or sunflower oil. Meal planning  Eat a balanced diet that includes: ? 5 or more servings of fruits and vegetables each day. At each meal, try to fill half of your plate with fruits and vegetables. ? Up to 6-8 servings of whole grains each day. ? Less than 6 oz of lean meat, poultry, or fish each day. A 3-oz serving of meat is about the same size as a deck of cards. One egg equals 1 oz. ? 2 servings of low-fat dairy each day. ? A serving of nuts, seeds, or beans 5 times each week. ? Heart-healthy fats. Healthy fats called Omega-3 fatty acids are found in foods such as flaxseeds and coldwater fish, like sardines, salmon, and mackerel.  Limit how much you eat of the following: ? Canned or prepackaged foods. ? Food that is high in trans fat, such as fried foods. ? Food that is high in saturated fat, such as fatty meat. ? Sweets, desserts, sugary drinks, and other foods with added sugar. ? Full-fat dairy products.  Do not salt foods before eating.  Try to eat at least 2 vegetarian meals each week.  Eat more home-cooked food and less restaurant, buffet, and  fast food.  When eating at a restaurant, ask that your food be prepared with less salt or no salt, if possible. What foods are recommended? The items listed may not be a complete list. Talk with your dietitian about what dietary choices are best for you. Grains Whole-grain or whole-wheat bread. Whole-grain or whole-wheat pasta. Brown rice. [place]. Bulgur. Whole-grain and low-sodium cereals. Pita bread. Low-fat, low-sodium crackers. Whole-wheat flour tortillas. Vegetables Fresh or frozen vegetables (raw, steamed, roasted, or grilled). Low-sodium or reduced-sodium  tomato and vegetable juice. Low-sodium or reduced-sodium tomato sauce and tomato paste. Low-sodium or reduced-sodium canned vegetables. Fruits All fresh, dried, or frozen fruit. Canned fruit in natural juice (without added sugar). Meat and other protein foods Skinless chicken or [place]. Ground chicken or [place]. Pork with fat trimmed off. Fish and seafood. Egg whites. Dried beans, peas, or lentils. Unsalted nuts, nut butters, and seeds. Unsalted canned beans. Lean cuts of beef with fat trimmed off. Low-sodium, lean deli meat. Dairy Low-fat (1%) or fat-free (skim) milk. Fat-free, low-fat, or reduced-fat cheeses. Nonfat, low-sodium ricotta or cottage cheese. Low-fat or nonfat yogurt. Low-fat, low-sodium cheese. Fats and oils Soft margarine without trans fats. Vegetable oil. Low-fat, reduced-fat, or light mayonnaise and salad dressings (reduced-sodium). Canola, safflower, olive, soybean, and sunflower oils. Avocado. Seasoning and other foods Herbs. Spices. Seasoning mixes without salt. Unsalted popcorn and pretzels. Fat-free sweets. What foods are not recommended? The items listed may not be a complete list. Talk with your dietitian about what dietary choices are best for you. Grains Baked goods made with fat, such as croissants, muffins, or some breads. Dry pasta or rice meal packs. Vegetables Creamed or fried vegetables. Vegetables in a cheese sauce. Regular canned vegetables (not low-sodium or reduced-sodium). Regular canned tomato sauce and paste (not low-sodium or reduced-sodium). Regular tomato and vegetable juice (not low-sodium or reduced-sodium). Angie Fava. Olives. Fruits Canned fruit in a light or heavy syrup. Fried fruit. Fruit in cream or butter sauce. Meat and other protein foods Fatty cuts of meat. Ribs. Fried meat. Berniece Salines. Sausage. Bologna and other processed lunch meats. Salami. Fatback. Hotdogs. Bratwurst. Salted nuts and seeds. Canned beans with added salt. Canned or smoked fish.  Whole eggs or egg yolks. Chicken or [place] with skin. Dairy Whole or 2% milk, cream, and half-and-half. Whole or full-fat cream cheese. Whole-fat or sweetened yogurt. Full-fat cheese. Nondairy creamers. Whipped toppings. Processed cheese and cheese spreads. Fats and oils Butter. Stick margarine. Lard. Shortening. Ghee. Bacon fat. Tropical oils, such as coconut, palm kernel, or palm oil. Seasoning and other foods Salted popcorn and pretzels. Onion salt, garlic salt, seasoned salt, table salt, and sea salt. Worcestershire sauce. Tartar sauce. Barbecue sauce. Teriyaki sauce. Soy sauce, including reduced-sodium. Steak sauce. Canned and packaged gravies. Fish sauce. Oyster sauce. Cocktail sauce. Horseradish that you find on the shelf. Ketchup. Mustard. Meat flavorings and tenderizers. Bouillon cubes. Hot sauce and Tabasco sauce. Premade or packaged marinades. Premade or packaged taco seasonings. Relishes. Regular salad dressings. Where to find more information:  National Heart, Lung, and [place]: https://wilson-eaton.com/  American Heart Association: www.heart.org Summary  The DASH eating plan is a healthy eating plan that has been shown to reduce high blood pressure (hypertension). It may also reduce your risk for type 2 diabetes, heart disease, and stroke.  With the DASH eating plan, you should limit salt (sodium) intake to 2,300 mg a day. If you have hypertension, you may need to reduce your sodium intake to 1,500 mg a day.  When on the  DASH eating plan, aim to eat more fresh fruits and vegetables, whole grains, lean proteins, low-fat dairy, and heart-healthy fats.  Work with your health care provider or diet and nutrition specialist (dietitian) to adjust your eating plan to your individual calorie needs. This information is not intended to replace advice given to you by your health care provider. Make sure you discuss any questions you have with your health care provider. Document Revised:  05/15/2017 Document Reviewed: 05/26/2016 Elsevier Patient Education  2020 [employer].

## 2019-10-03 NOTE — Assessment & Plan Note (Signed)
BP Readings from Last 3 Encounters:  10/03/19 (!) 166/94  08/03/19 (!) 155/117  12/01/17 138/82   Will restart the lisinopril but add HCTZ He prefers to stay off the amlodipine--but we might consider a lower dose

## 2019-10-03 NOTE — Assessment & Plan Note (Signed)
Uses omeprazole prn 

## 2019-10-03 NOTE — Addendum Note (Signed)
Addended by: Eual Fines on: 10/03/2019 04:33 PM   Modules accepted: Orders
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
## 2019-10-03 NOTE — Assessment & Plan Note (Signed)
Healthy but out of shape Discussed working out Tdap Had COVID vaccine Recommended flu vaccine in fall--but makes him sick

## 2019-10-10 ENCOUNTER — Telehealth: Payer: Self-pay | Admitting: Internal Medicine

## 2019-10-10 MED ORDER — NAPROXEN 500 MG PO TABS: 500.0000 mg | ORAL_TABLET | Freq: Two times a day (BID) | ORAL | 5 refills | Status: DC | PRN

## 2019-10-10 NOTE — Telephone Encounter (Signed)
He said the Flexeril made him very sleepy so only took little bits at a time of a tablet but it still made him sleepy. [place] Spaniel it took awhile for it to help the pain.   The tizanidine did not work at all. He is not taking the naproxen as he did not have a refill.  Right now says his arm is throbbing really bad. Tries to move it and flex it. Says it is spasms/cramps. Thinks maybe something for nerve pain.

## 2019-10-10 NOTE — Telephone Encounter (Signed)
Spoke to pt. He said he has tried the patches. Said they do not stick to him when he sweats. Says they curl up.

## 2019-10-10 NOTE — Telephone Encounter (Signed)
Spoke to pt. BP is doing better. He will update [place] in about a week on both his BP and Pain.

## 2019-10-10 NOTE — Telephone Encounter (Signed)
Have him just try the naproxen again for now and then let me know in a week if still having problems. Again, I am not sure this is nerve pain

## 2019-10-10 NOTE — Telephone Encounter (Signed)
Patient called today in regards to medication he was prescribed for muscle spasm    Patient was prescribed Tizanidine for the spasm. Patient feels like it is making them worse instead of helping . He would like to know if there is a medication that he can prescribed that will help with nerve pain instead.   Please advise

## 2019-10-10 NOTE — Telephone Encounter (Signed)
Let him know I sent a new prescription for the naproxen--that is most likely to help. He can also try an over the counter lidocaine patch (like salon pas brand) over the painful area for 12 hours per day. This is the mostly likely Rx that will help

## 2019-10-10 NOTE — Telephone Encounter (Signed)
He didn't really describe nerve pain--more muscle Did the cyclobenzaprine help? Have him describe what he is experiencing now--before I try something else

## 2019-10-12 DIAGNOSIS — M542 Cervicalgia: Secondary | ICD-10-CM | POA: Diagnosis not present

## 2019-10-12 DIAGNOSIS — M5412 Radiculopathy, cervical region: Secondary | ICD-10-CM | POA: Diagnosis not present

## 2019-11-02 DIAGNOSIS — M542 Cervicalgia: Secondary | ICD-10-CM | POA: Diagnosis not present

## 2019-11-02 DIAGNOSIS — M5412 Radiculopathy, cervical region: Secondary | ICD-10-CM | POA: Diagnosis not present

## 2019-11-03 ENCOUNTER — Other Ambulatory Visit: Payer: Self-pay | Admitting: Orthopedic Surgery

## 2019-11-03 DIAGNOSIS — M549 Dorsalgia, unspecified: Secondary | ICD-10-CM

## 2019-11-03 DIAGNOSIS — Z77018 Contact with and (suspected) exposure to other hazardous metals: Secondary | ICD-10-CM

## 2019-11-17 ENCOUNTER — Ambulatory Visit: Payer: BC Managed Care – PPO | Admitting: Internal Medicine

## 2019-12-07 ENCOUNTER — Other Ambulatory Visit: Payer: Self-pay | Admitting: Orthopedic Surgery

## 2019-12-08 ENCOUNTER — Ambulatory Visit
Admission: RE | Admit: 2019-12-08 | Discharge: 2019-12-08 | Disposition: A | Discharge status: Home / Self Care | Payer: BC Managed Care – PPO | Source: Ambulatory Visit | Attending: Orthopedic Surgery | Admitting: Orthopedic Surgery

## 2019-12-08 ENCOUNTER — Other Ambulatory Visit: Payer: Self-pay

## 2019-12-08 DIAGNOSIS — Z77018 Contact with and (suspected) exposure to other hazardous metals: Secondary | ICD-10-CM

## 2019-12-08 DIAGNOSIS — M549 Dorsalgia, unspecified: Secondary | ICD-10-CM

## 2019-12-08 DIAGNOSIS — M4802 Spinal stenosis, cervical region: Secondary | ICD-10-CM | POA: Diagnosis not present

## 2019-12-08 DIAGNOSIS — Z01818 Encounter for other preprocedural examination: Secondary | ICD-10-CM | POA: Diagnosis not present

## 2019-12-08 IMAGING — MR MR CERVICAL SPINE W/O CM
4 of 5 series · 24 of 48 positions shown · non-contrast
Comparison: MRI of the cervical spine [DATE]

CLINICAL DATA: Neck pain.

EXAM:
MRI CERVICAL SPINE WITHOUT CONTRAST
TECHNIQUE: Multiplanar, multisequence MR imaging of the cervical spine was
performed. No intravenous contrast was administered.

[Series 2: T2 post-contrast · sagittal · 3.3mm · 0.37mm/px · 6 of 13 slices shown]
[im 1/13]
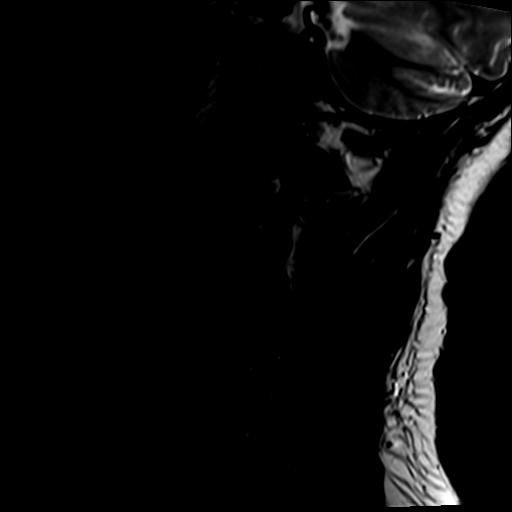
[im 3/13]
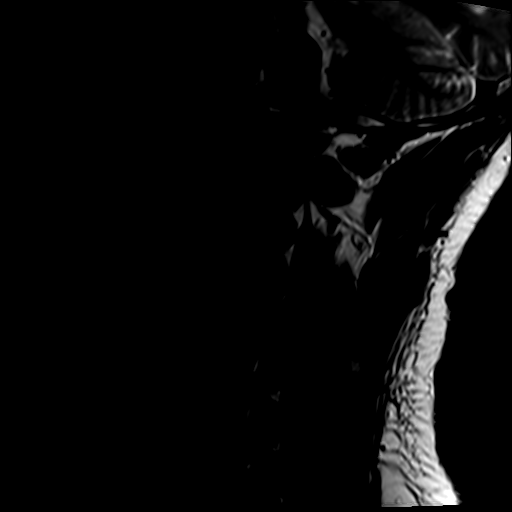
[im 5/13]
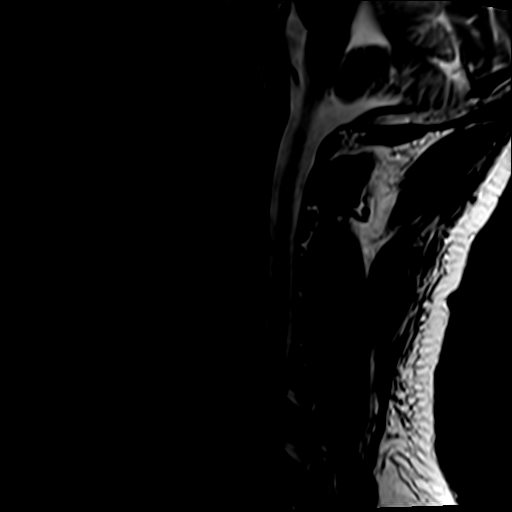
[im 8/13]
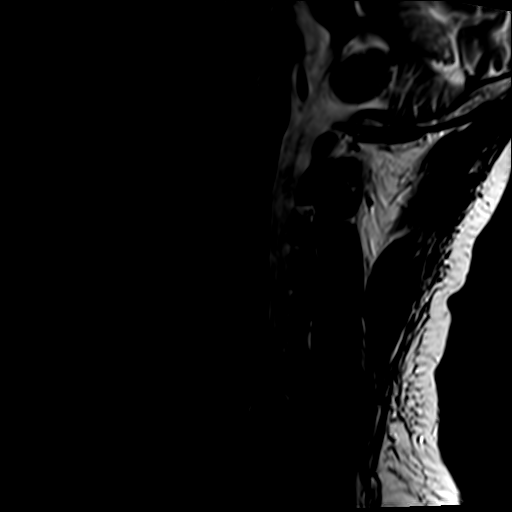
[im 10/13]
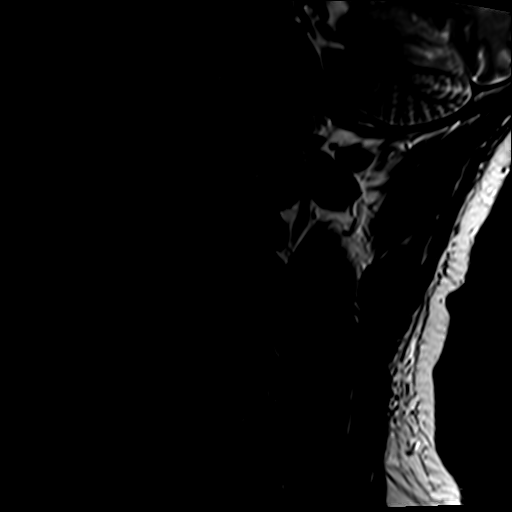
[im 13/13]
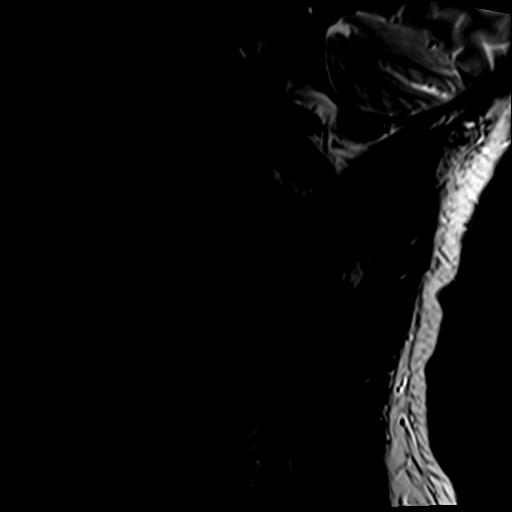

[Series 3: T1 · sagittal · 3.3mm · 0.37mm/px · 7 of 13 slices shown]
[im 1/13]
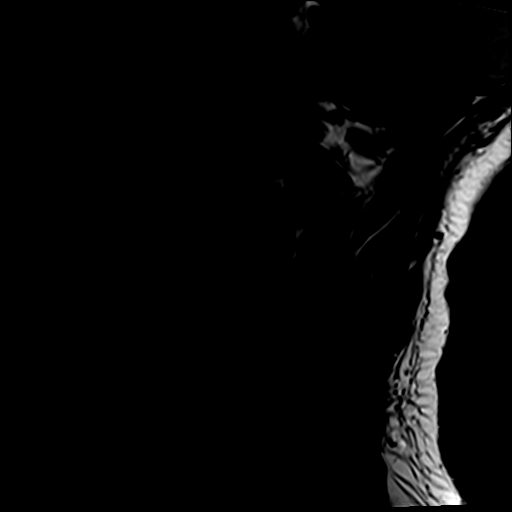
[im 3/13]
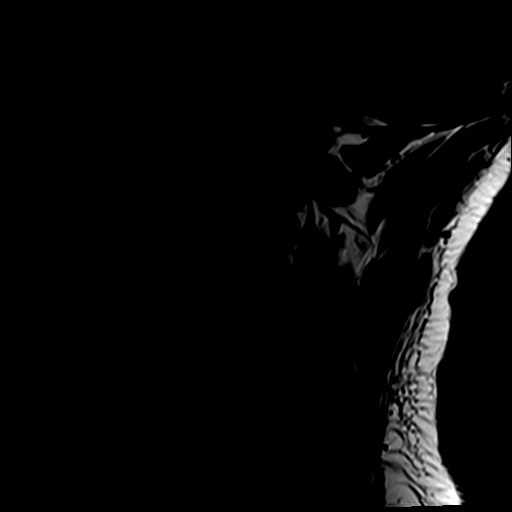
[im 5/13]
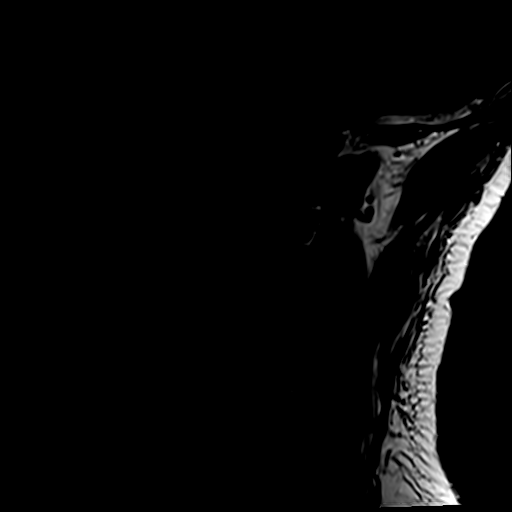
[im 7/13]
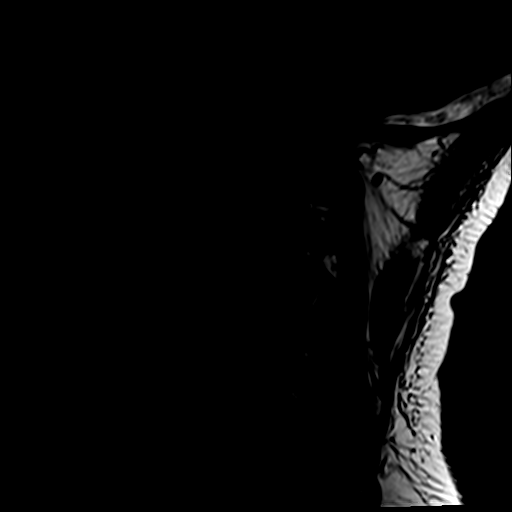
[im 9/13]
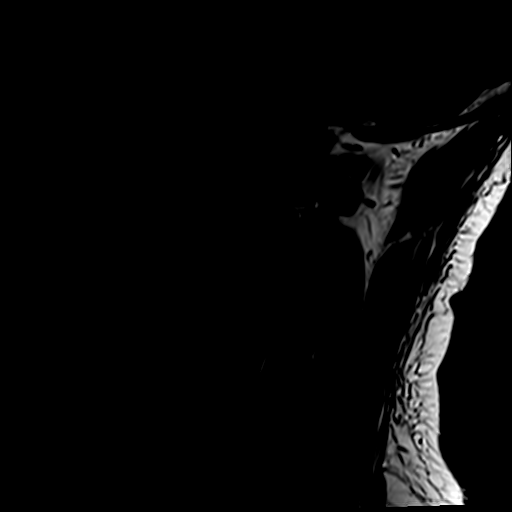
[im 11/13]
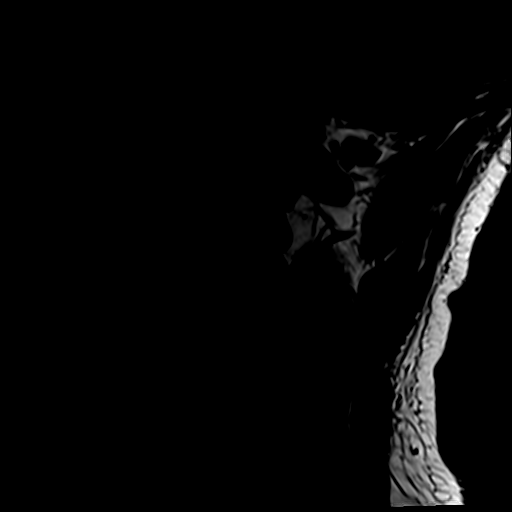
[im 13/13]
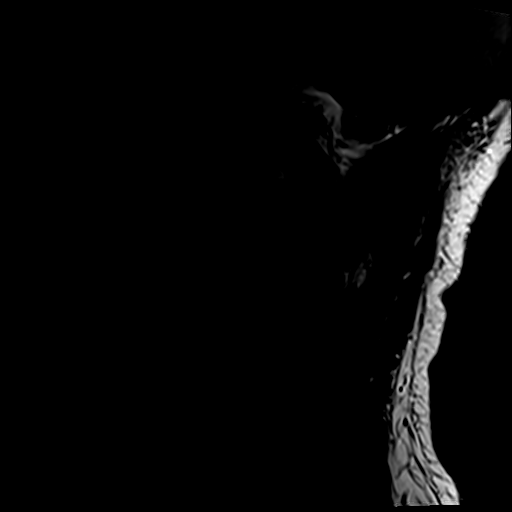

[Series 4: tir sag · sagittal · 3.3mm · 0.37mm/px · 3 of 13 slices shown]
[im 3/13]
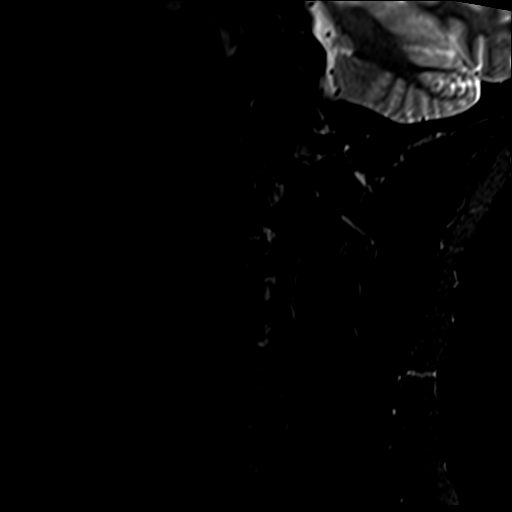
[im 7/13]
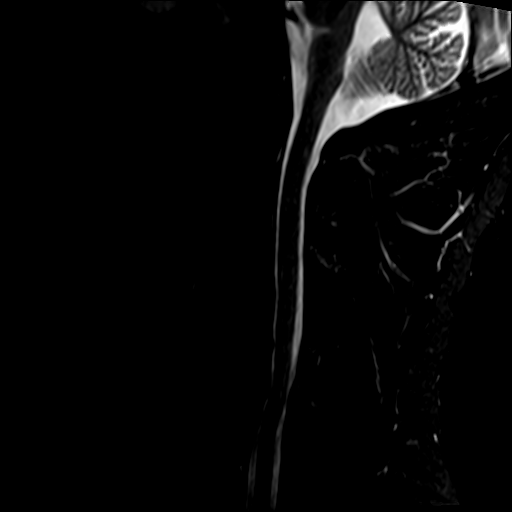
[im 11/13]
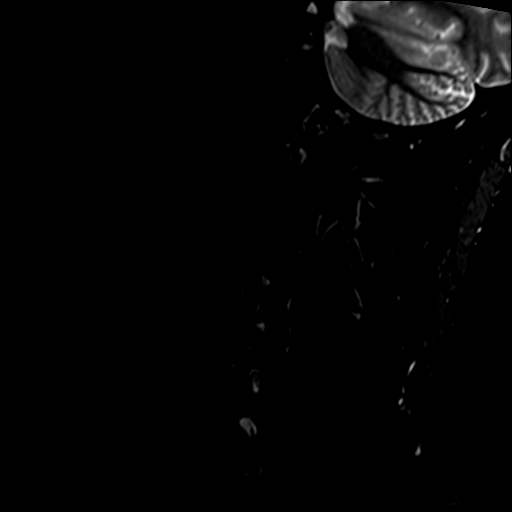

[Series 6: T2 · axial · 3.0mm · 0.70mm/px · z∈[-60,+47]mm · 8 of 26 slices shown]
[im 1/26]
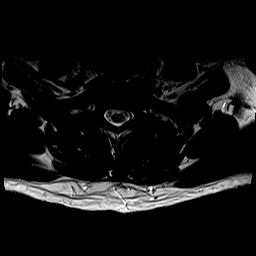
[im 4/26]
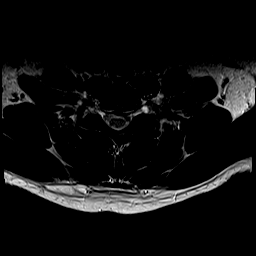
[im 8/26]
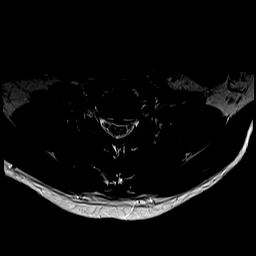
[im 12/26]
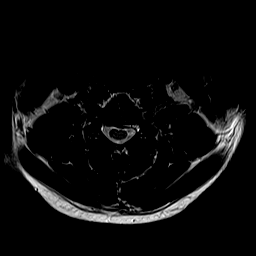
[im 14/26]
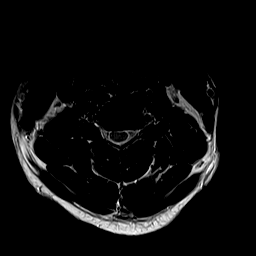
[im 18/26]
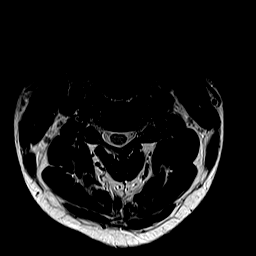
[im 22/26]
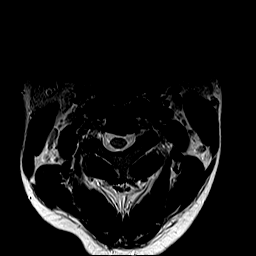
[im 26/26]
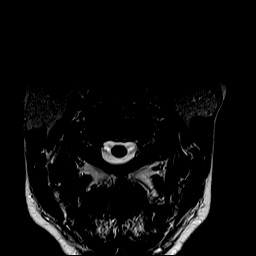

[24 of 48 positions shown; findings below may reference images not displayed]

FINDINGS: Alignment: Straightening of the cervical curvature

Vertebrae: No fracture, evidence of discitis, or bone lesion.

Cord: Normal signal and morphology.

Posterior Fossa, vertebral arteries, paraspinal tissues: Negative.

Disc levels:

C2-3: Facet degenerative changes resulting in minimal left neural
foraminal narrowing. No spinal canal stenosis.

C3-4: Uncovertebral and facet degenerative changes resulting in
minimal left neural foraminal stenosis.

C4-5: Tiny posterior disc protrusion without significant spinal
canal stenosis. Uncovertebral and facet degenerative changes
resulting in mild right and moderate left neural foraminal
narrowing, progressed from prior MRI.

C5-6: Small posterior disc protrusion without significant spinal
canal stenosis. Uncovertebral and facet degenerative changes
resulting in mild right and moderate left neural foraminal
narrowing, unchanged.

C6-7: Posterior disc protrusion resulting in mild narrowing of the
spinal canal. Uncovertebral and facet degenerative changes resulting
in mild right and moderate left neural foraminal narrowing,
unchanged.

C7-T1: Small posterior disc protrusion without significant spinal
canal stenosis. Mild facet degenerative changes without significant
neural foraminal narrowing.
IMPRESSION: 1. Multilevel degenerative changes of the cervical spine as
described above, with progression of moderate left neural foraminal
narrowing at C4-5.
2. Unchanged moderate left neural foraminal narrowing at C5-6 and
C6-7.

## 2019-12-08 IMAGING — CR DG ORBITS FOR FOREIGN BODY
2 series · 2 of 2 positions shown · non-contrast
Comparison: None.

CLINICAL DATA: Metal working/exposure; clearance prior to MRI

EXAM:
ORBITS FOR FOREIGN BODY - 2 VIEW

[w orbit pa (1 of 2)]
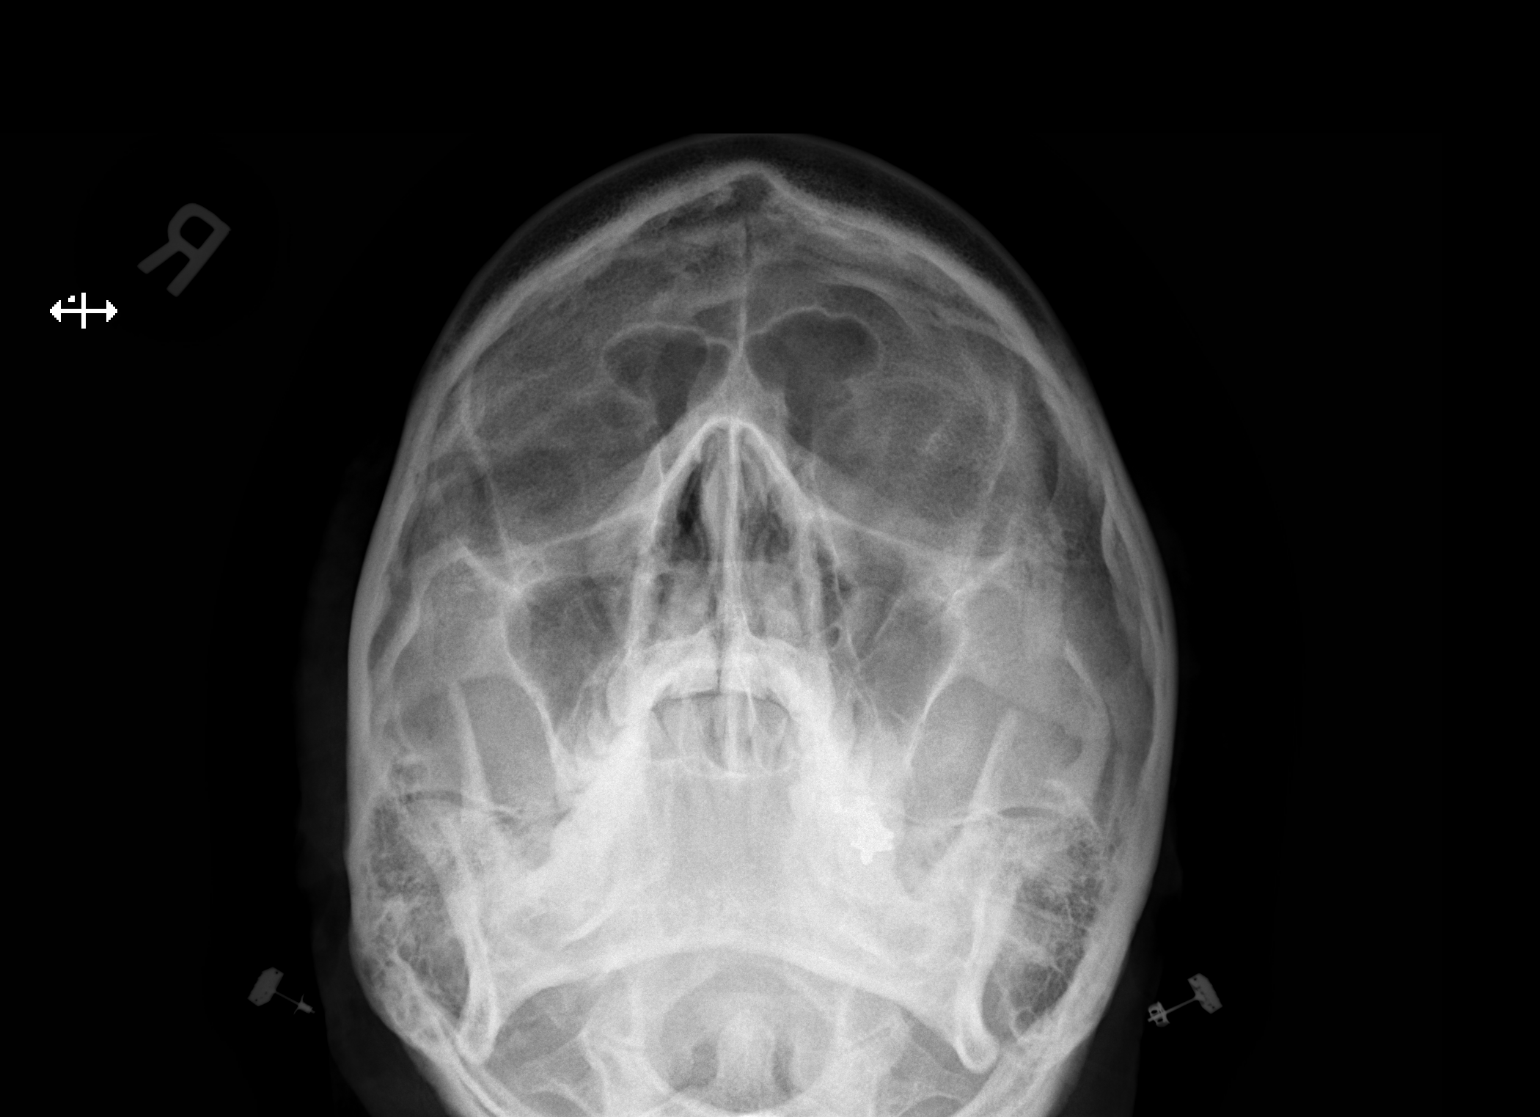

[w orbit pa (2 of 2)]
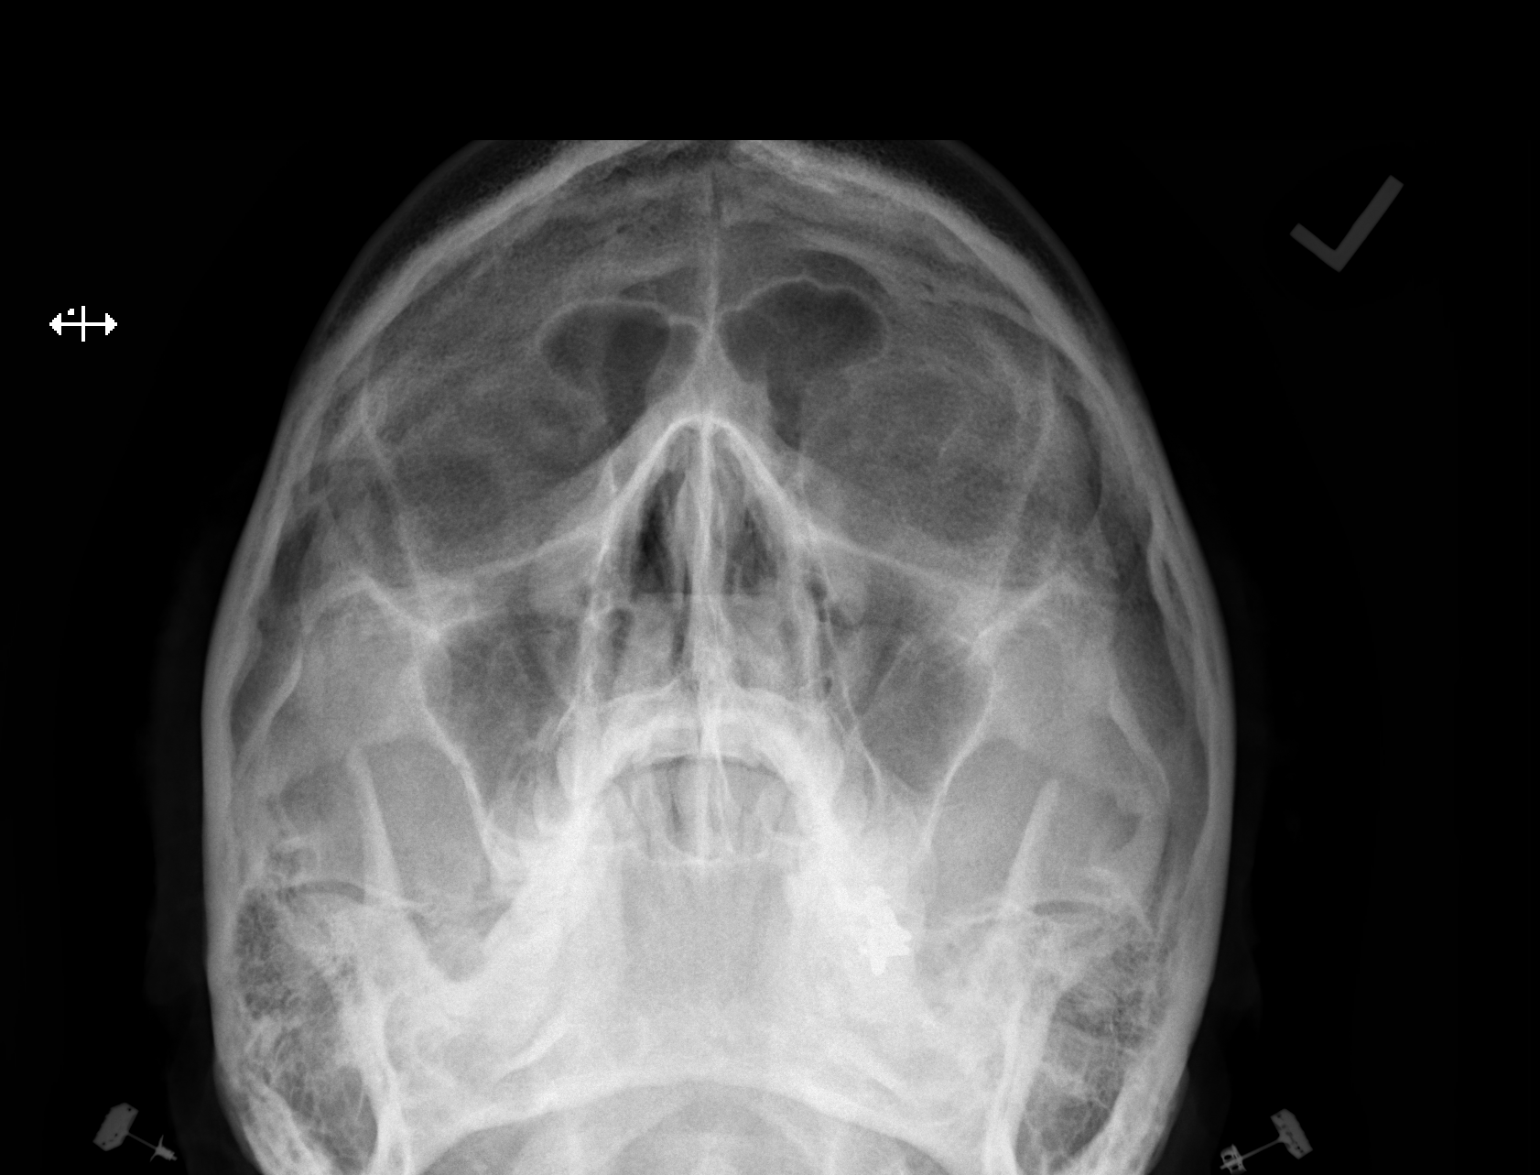

[2 of 2 positions shown; findings below may reference images not displayed]

FINDINGS: There is no evidence of metallic foreign body within the orbits. No
significant bone abnormality identified.
IMPRESSION: No evidence of metallic foreign body within the orbits.

## 2020-01-17 DIAGNOSIS — M542 Cervicalgia: Secondary | ICD-10-CM | POA: Diagnosis not present

## 2020-01-17 DIAGNOSIS — M5412 Radiculopathy, cervical region: Secondary | ICD-10-CM | POA: Diagnosis not present

## 2020-02-06 DIAGNOSIS — M5412 Radiculopathy, cervical region: Secondary | ICD-10-CM | POA: Diagnosis not present

## 2020-02-06 DIAGNOSIS — M542 Cervicalgia: Secondary | ICD-10-CM | POA: Diagnosis not present

## 2020-08-07 ENCOUNTER — Telehealth: Payer: Self-pay | Admitting: Internal Medicine

## 2020-08-07 DIAGNOSIS — M5412 Radiculopathy, cervical region: Secondary | ICD-10-CM | POA: Diagnosis not present

## 2020-08-07 NOTE — Telephone Encounter (Signed)
Spoke to pt. He was supposed to have followed up 6 weeks after his appt in April 2021. He did not. I made him an appt for 08-16-20 at 345. In the meantime, he said his BP is running 140s/90s. 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 I read to him what was said in the OV note about increasing the lisinopril-hctz to 2 a day if it was not working a week after. Will get with Dr Alphonsus Sias to see what to do until his appt.

## 2020-08-07 NOTE — Telephone Encounter (Signed)
Patient called in stating that he is still having some issues with the BP medication he is currently on . Patient would like to discuss other options of medication . EM

## 2020-08-08 NOTE — Telephone Encounter (Signed)
Spoke to pt

## 2020-08-08 NOTE — Telephone Encounter (Signed)
Yeah---if he is not taking 2 tabs daily at this point, he should be. If still high, will need to add another medication

## 2020-08-08 NOTE — Telephone Encounter (Signed)
Please call patient back as he returned your call. EM

## 2020-08-08 NOTE — Telephone Encounter (Signed)
Left message to call office

## 2020-08-15 ENCOUNTER — Other Ambulatory Visit: Payer: Self-pay | Admitting: Neurological Surgery

## 2020-08-15 DIAGNOSIS — M5412 Radiculopathy, cervical region: Secondary | ICD-10-CM

## 2020-08-16 ENCOUNTER — Other Ambulatory Visit: Payer: Self-pay

## 2020-08-16 ENCOUNTER — Encounter: Payer: Self-pay | Admitting: Internal Medicine

## 2020-08-16 ENCOUNTER — Ambulatory Visit: Payer: BC Managed Care – PPO | Admitting: Internal Medicine

## 2020-08-16 DIAGNOSIS — I10 Essential (primary) hypertension: Secondary | ICD-10-CM

## 2020-08-16 MED ORDER — AMLODIPINE BESYLATE 2.5 MG PO TABS: 2.5000 mg | ORAL_TABLET | Freq: Every day | ORAL | 3 refills | Status: DC

## 2020-08-16 NOTE — Assessment & Plan Note (Signed)
BP Readings from Last 3 Encounters:  08/16/20 138/70  10/03/19 (!) 166/94  08/03/19 (!) 155/117   Labile at home Taking the lisinopril/HCTZ Will add low dose amlodipine at bedtime----unlikely to give him edema

## 2020-08-16 NOTE — Patient Instructions (Signed)
Take the amlodipine in the evening--bedtime is okay
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
## 2020-08-17 ENCOUNTER — Telehealth: Payer: Self-pay | Admitting: Internal Medicine

## 2020-08-17 MED ORDER — AMLODIPINE BESYLATE 2.5 MG PO TABS: 2.5000 mg | ORAL_TABLET | Freq: Every day | ORAL | 3 refills | Status: DC

## 2020-08-17 NOTE — Telephone Encounter (Signed)
Spoke to pt. Sent rx to requested pharmacy.

## 2020-08-17 NOTE — Telephone Encounter (Signed)
Pt called in wanted to know if the prescription can be transferred from [facility] on [address] and wanted to know changing it to the [facility] on [address] due to the system being down

## 2020-08-28 ENCOUNTER — Ambulatory Visit
Admission: RE | Admit: 2020-08-28 | Discharge: 2020-08-28 | Disposition: A | Discharge status: Home / Self Care | Payer: BC Managed Care – PPO | Source: Ambulatory Visit | Attending: Neurological Surgery | Admitting: Neurological Surgery

## 2020-08-28 ENCOUNTER — Other Ambulatory Visit: Payer: Self-pay

## 2020-08-28 DIAGNOSIS — M4802 Spinal stenosis, cervical region: Secondary | ICD-10-CM | POA: Diagnosis not present

## 2020-08-28 DIAGNOSIS — M5412 Radiculopathy, cervical region: Secondary | ICD-10-CM

## 2020-08-28 IMAGING — MR MR CERVICAL SPINE W/O CM
4 of 5 series · 27 of 48 positions shown · non-contrast
Comparison: MRI [DATE]

CLINICAL DATA: Neck pain radiating to both arms. Bilateral finger
numbness.

EXAM:
MRI CERVICAL SPINE WITHOUT CONTRAST
TECHNIQUE: Multiplanar, multisequence MR imaging of the cervical spine was
performed. No intravenous contrast was administered.

[Series 5: T2 · sagittal · 3.0mm · 0.55mm/px · 6 of 17 slices shown (1 of 2)]
[im 1/17]
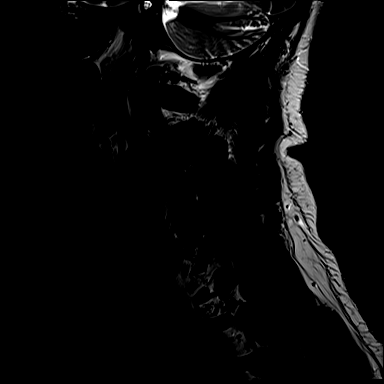
[im 4/17]
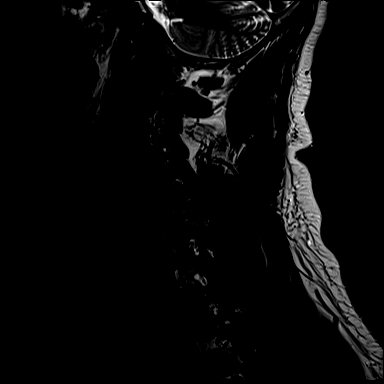
[im 7/17]
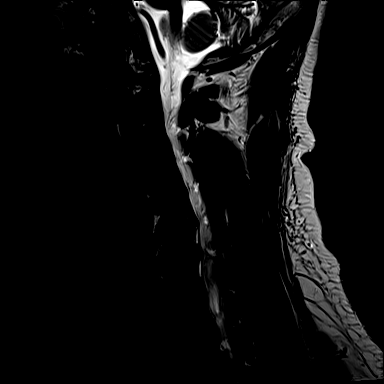
[im 10/17]
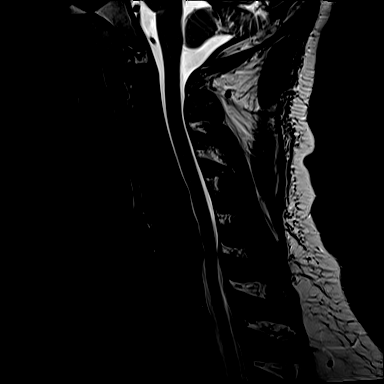
[im 13/17]
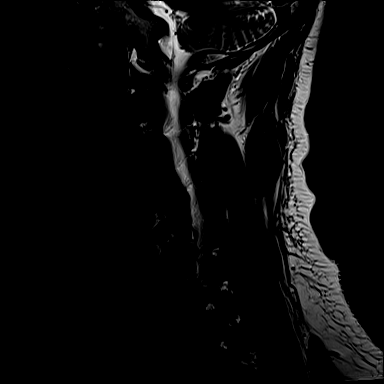
[im 17/17]
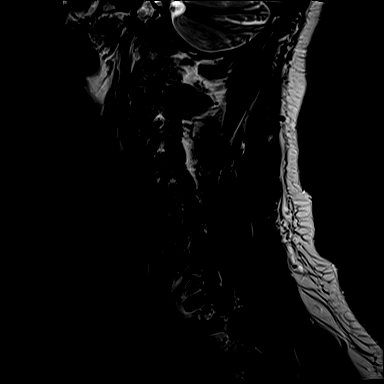

[Series 6: T1 · sagittal · 3.0mm · 0.66mm/px · 7 of 17 slices shown]
[im 1/17]
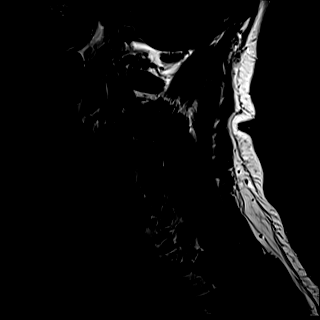
[im 3/17]
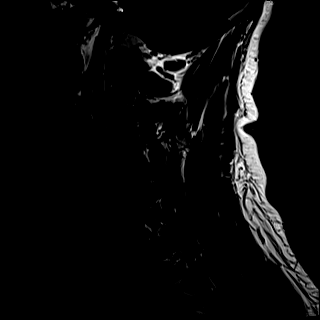
[im 6/17]
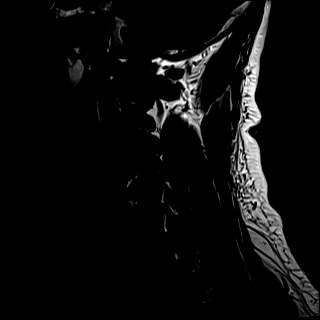
[im 9/17]
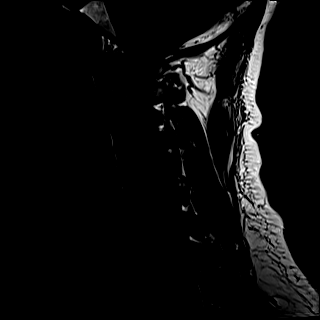
[im 11/17]
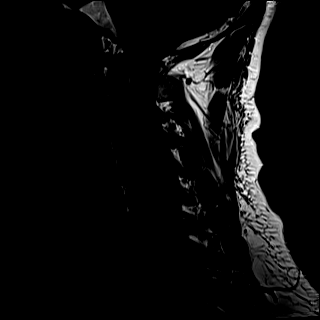
[im 14/17]
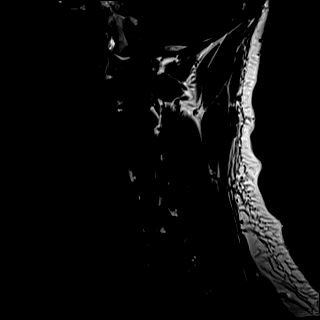
[im 17/17]
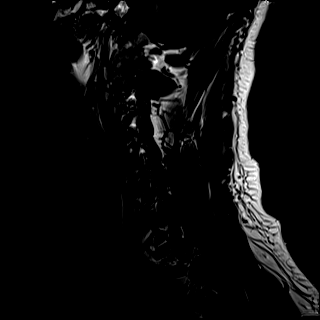

[Series 7: STIR · sagittal · 3.0mm · 0.33mm/px · 6 of 17 slices shown]
[im 1/17]
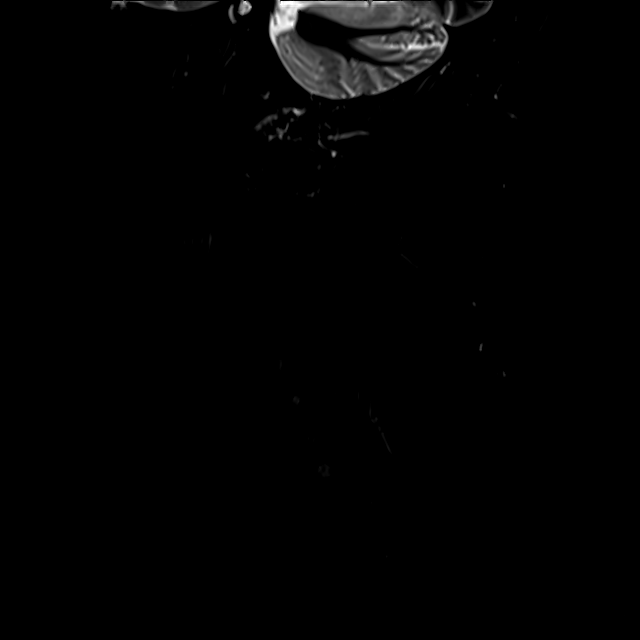
[im 3/17]
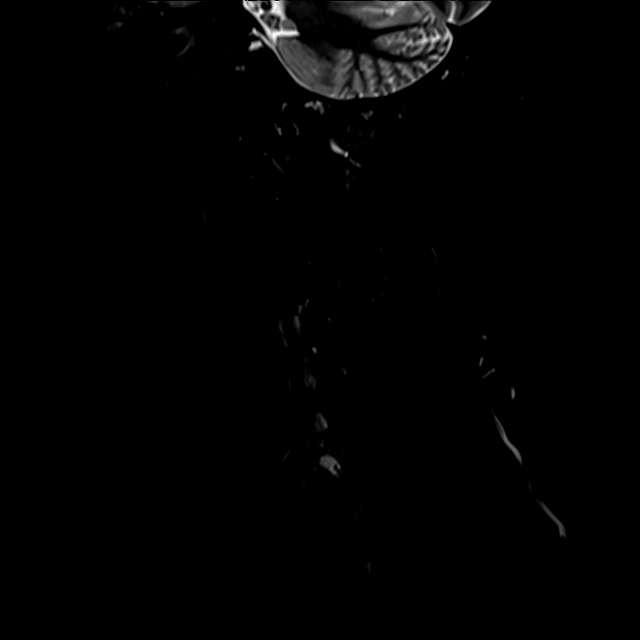
[im 6/17]
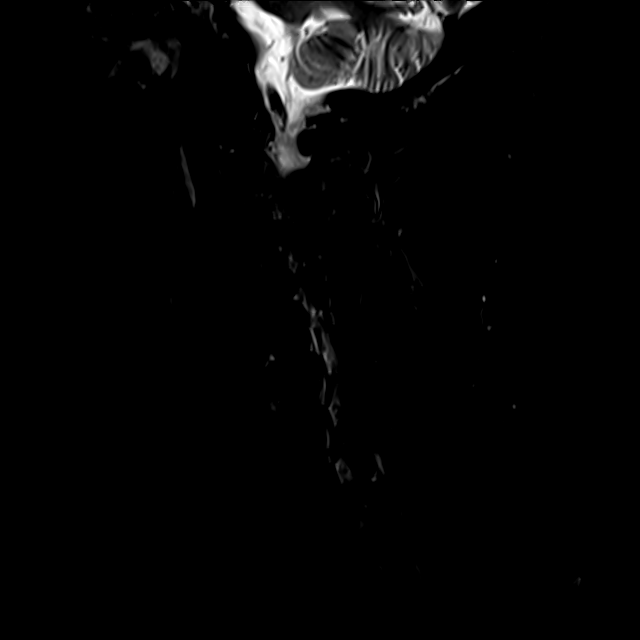
[im 9/17]
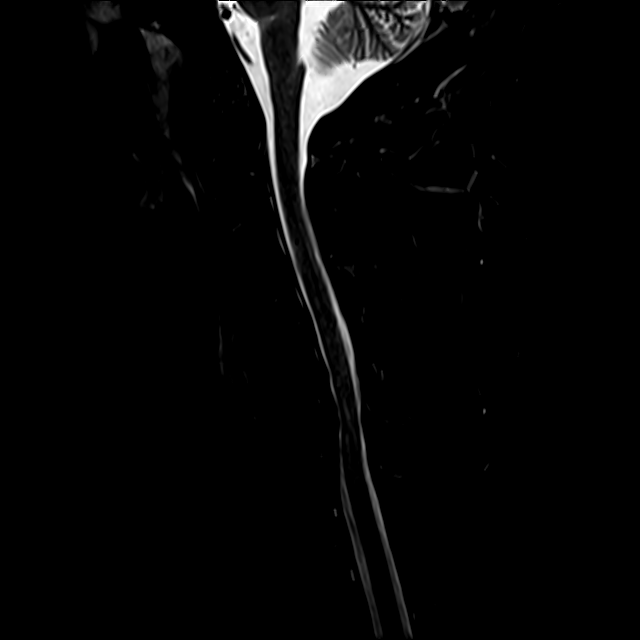
[im 11/17]
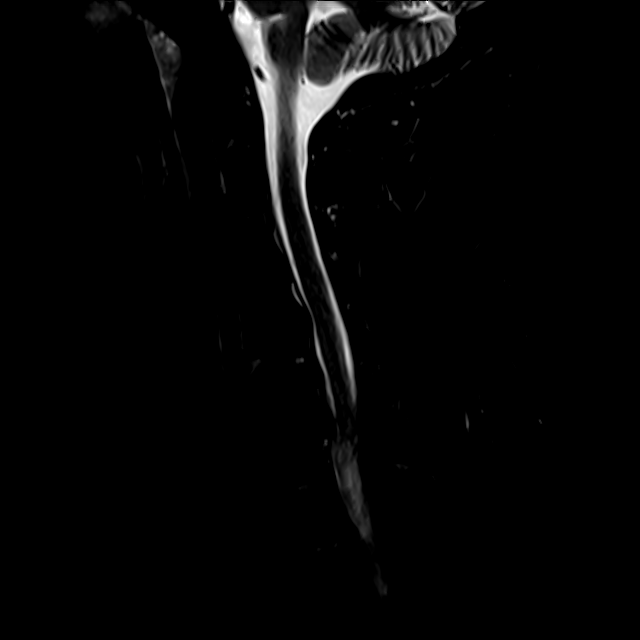
[im 14/17]
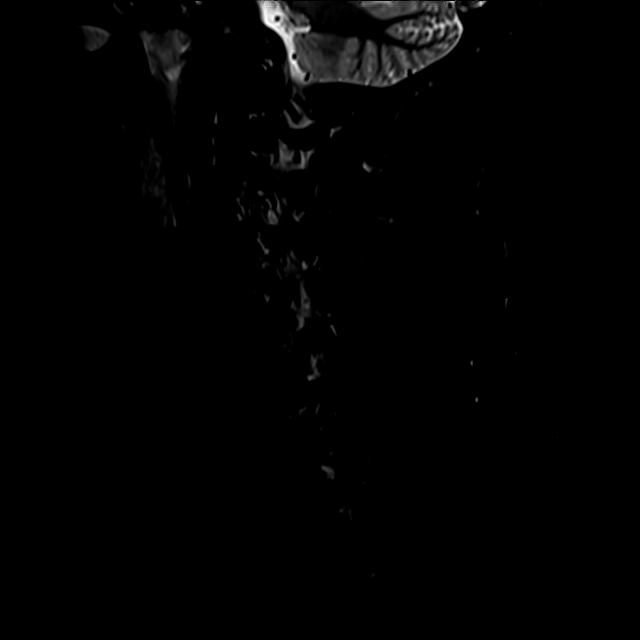

[Series 8: T2 · axial · 3.0mm · 0.50mm/px · z∈[-50,+51]mm · 8 of 33 slices shown (2 of 2)]
[im 1/33]
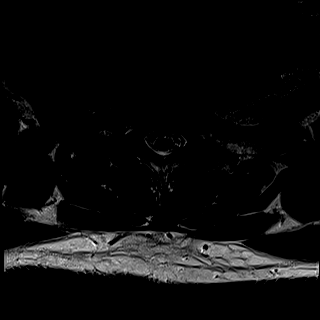
[im 5/33]
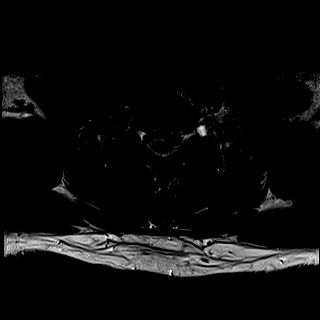
[im 10/33]
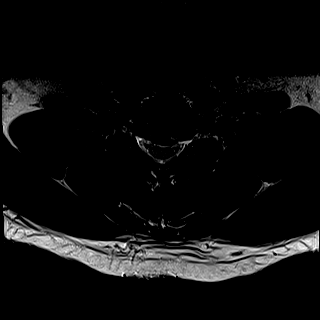
[im 15/33]
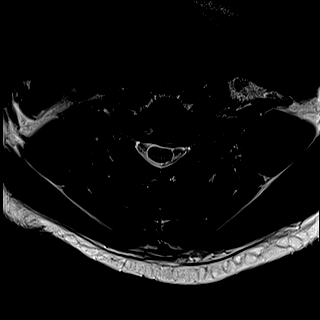
[im 18/33]
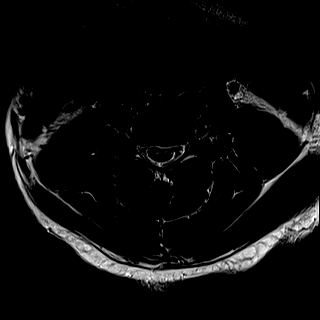
[im 23/33]
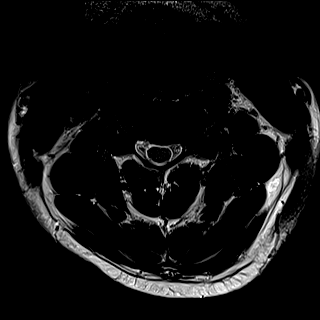
[im 28/33]
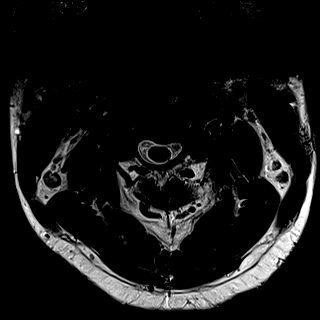
[im 33/33]
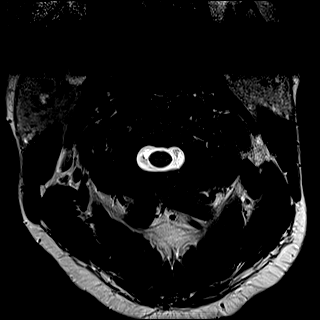

[27 of 48 positions shown; findings below may reference images not displayed]

FINDINGS: Alignment: Straightening of the normal cervical lordosis.

Vertebrae: No fracture or primary bone lesion.

Cord: No cord compression or primary cord lesion.

Posterior Fossa, vertebral arteries, paraspinal tissues: Negative

Disc levels:

Foramen magnum, C1-2 and C2-3 are normal.

C3-4: Minimal noncompressive disc bulge.

C4-5: Spondylosis with endplate osteophytes and bulging disc
material. No compressive canal stenosis. Moderate left foraminal
encroachment by osteophyte in disc material could affect the left C5
nerve.

C5-6: Endplate osteophytes and bulging disc material more prominent
towards the left. Narrowing of the ventral subarachnoid space but no
compression of the cord. Moderate left foraminal narrowing that
could affect the left C6 nerve.

C6-7: Broad-based, centrally predominant disc herniation with slight
upward migration behind C6. Effacement of the subarachnoid space but
no frank compression of the cord. AP diameter of the canal in the
midline 7.4 mm. Bilateral foraminal stenosis that could affect
either exiting C7 nerve.

C7-T1: Mild bulging of the disc. No compressive canal or foraminal
narrowing.

Since the study [DATE], the findings are very similar, possibly
with the exception of slight enlargement of the disc herniation at
C6-7.
IMPRESSION: 1. C6-7: Slight enlargement of a broad-based, centrally predominant
disc herniation with slight upward migration. Effacement of the
subarachnoid space but no compression of the cord. AP diameter of
the canal 7.4 mm. Bilateral foraminal stenosis that could affect
either C7 nerve.
2. C5-6: Left-sided predominant spondylosis and disc protrusion.
Left foraminal narrowing could affect the left C6 nerve. No visible
change.
3. C4-5: Left-sided predominant spondylosis and disc bulge. Moderate
left foraminal encroachment by osteophyte and disc material could
affect the left C5 nerve. No visible change.

## 2020-09-13 DIAGNOSIS — M5412 Radiculopathy, cervical region: Secondary | ICD-10-CM | POA: Diagnosis not present

## 2020-10-10 DIAGNOSIS — M5412 Radiculopathy, cervical region: Secondary | ICD-10-CM | POA: Diagnosis not present

## 2020-11-02 ENCOUNTER — Other Ambulatory Visit: Payer: Self-pay | Admitting: Internal Medicine

## 2020-12-07 DIAGNOSIS — M5412 Radiculopathy, cervical region: Secondary | ICD-10-CM | POA: Diagnosis not present

## 2020-12-07 DIAGNOSIS — M50123 Cervical disc disorder at C6-C7 level with radiculopathy: Secondary | ICD-10-CM | POA: Diagnosis not present

## 2021-01-11 DIAGNOSIS — M5412 Radiculopathy, cervical region: Secondary | ICD-10-CM | POA: Diagnosis not present

## 2021-02-07 DIAGNOSIS — M5412 Radiculopathy, cervical region: Secondary | ICD-10-CM | POA: Diagnosis not present

## 2021-02-13 ENCOUNTER — Other Ambulatory Visit: Payer: Self-pay | Admitting: Neurological Surgery

## 2021-02-13 DIAGNOSIS — M5412 Radiculopathy, cervical region: Secondary | ICD-10-CM

## 2021-02-14 ENCOUNTER — Encounter: Payer: BC Managed Care – PPO | Admitting: Internal Medicine

## 2021-02-24 ENCOUNTER — Other Ambulatory Visit: Payer: Self-pay

## 2021-02-24 ENCOUNTER — Ambulatory Visit
Admission: RE | Admit: 2021-02-24 | Discharge: 2021-02-24 | Disposition: A | Discharge status: Home / Self Care | Payer: BC Managed Care – PPO | Source: Ambulatory Visit | Attending: Neurological Surgery | Admitting: Neurological Surgery

## 2021-02-24 DIAGNOSIS — M4802 Spinal stenosis, cervical region: Secondary | ICD-10-CM | POA: Diagnosis not present

## 2021-02-24 DIAGNOSIS — M542 Cervicalgia: Secondary | ICD-10-CM | POA: Diagnosis not present

## 2021-02-24 DIAGNOSIS — M5412 Radiculopathy, cervical region: Secondary | ICD-10-CM

## 2021-02-24 IMAGING — MR MR CERVICAL SPINE W/O CM
4 of 5 series · 26 of 48 positions shown · non-contrast
Comparison: MRI from [DATE].

CLINICAL DATA: Initial evaluation for neck pain, pain at right
tricep.

EXAM:
MRI CERVICAL SPINE WITHOUT CONTRAST
TECHNIQUE: Multiplanar, multisequence MR imaging of the cervical spine was
performed. No intravenous contrast was administered.

[Series 5: T2 · sagittal · 3.0mm · 0.55mm/px · 6 of 15 slices shown (1 of 2)]
[im 1/15]
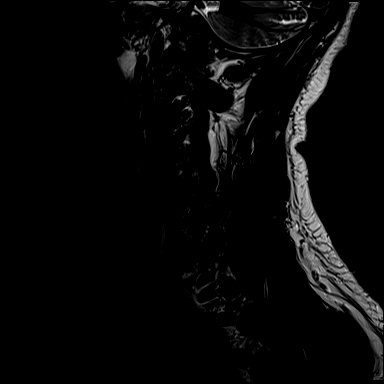
[im 3/15]
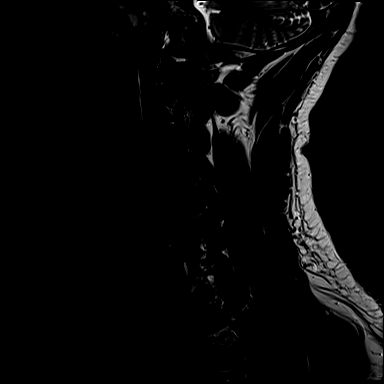
[im 6/15]
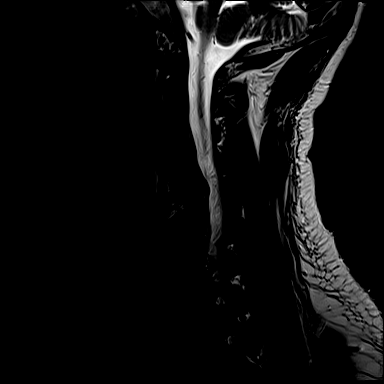
[im 9/15]
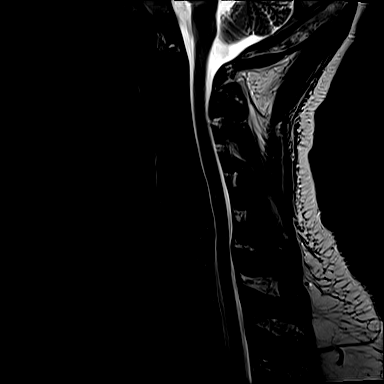
[im 12/15]
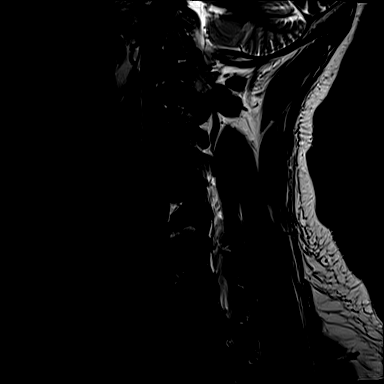
[im 15/15]
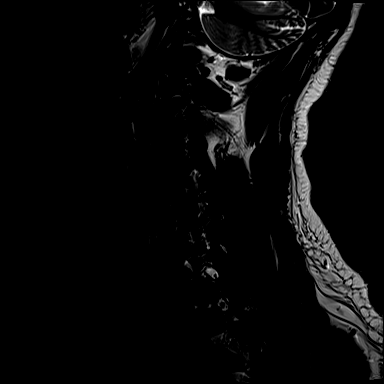

[Series 6: T1 · sagittal · 3.0mm · 0.66mm/px · 6 of 15 slices shown]
[im 1/15]
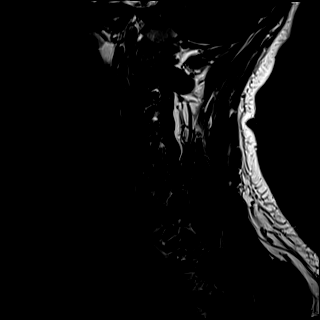
[im 3/15]
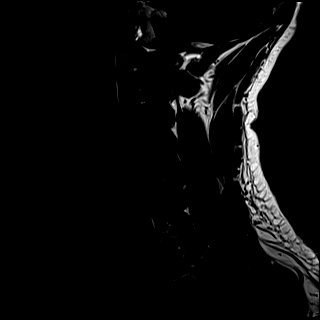
[im 6/15]
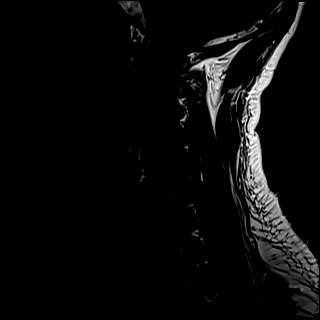
[im 9/15]
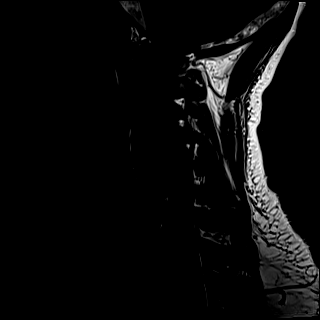
[im 12/15]
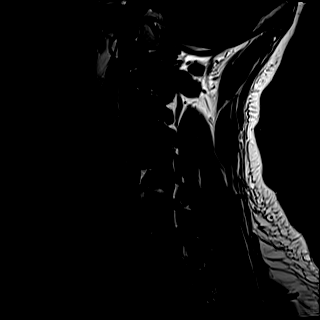
[im 15/15]
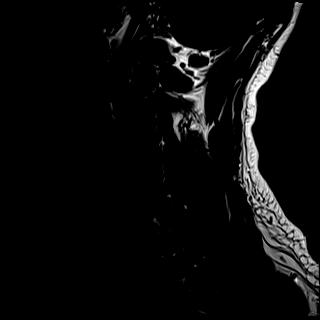

[Series 7: STIR · sagittal · 3.0mm · 0.33mm/px · 5 of 15 slices shown]
[im 1/15]
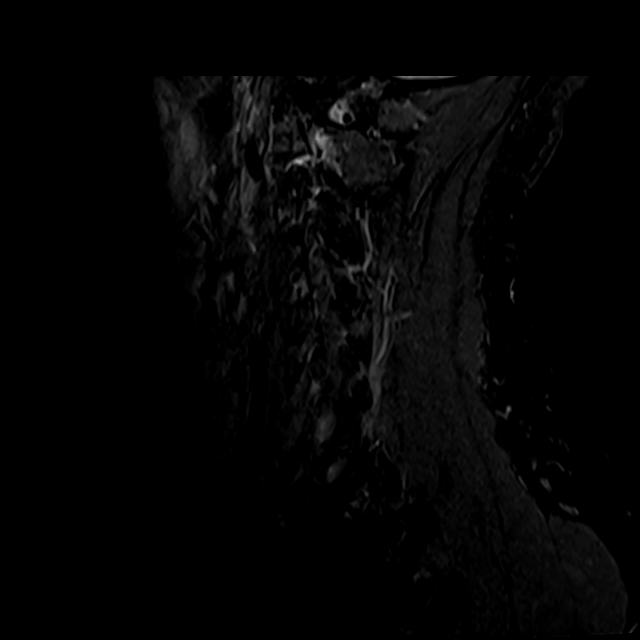
[im 3/15]
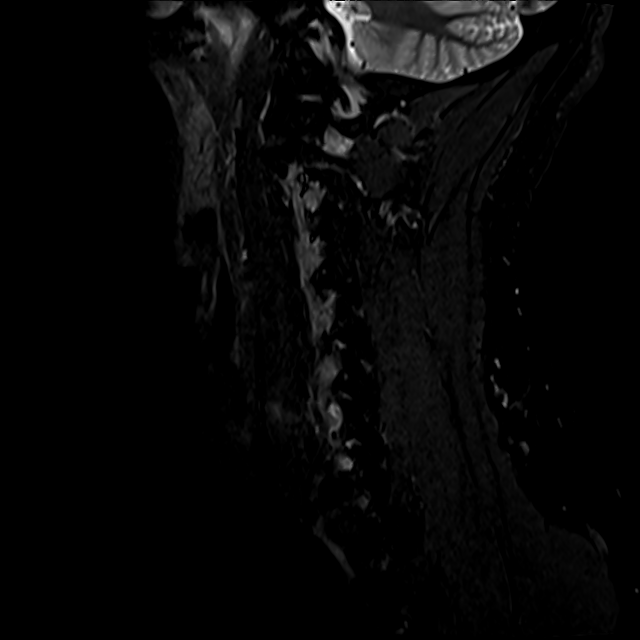
[im 6/15]
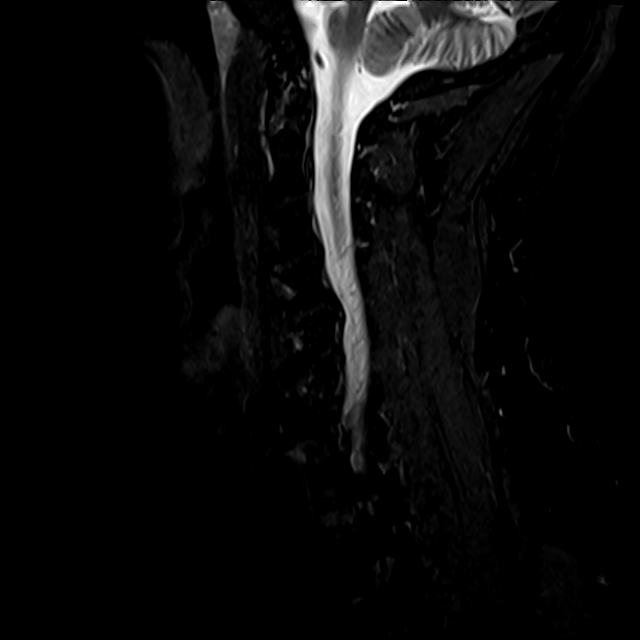
[im 9/15]
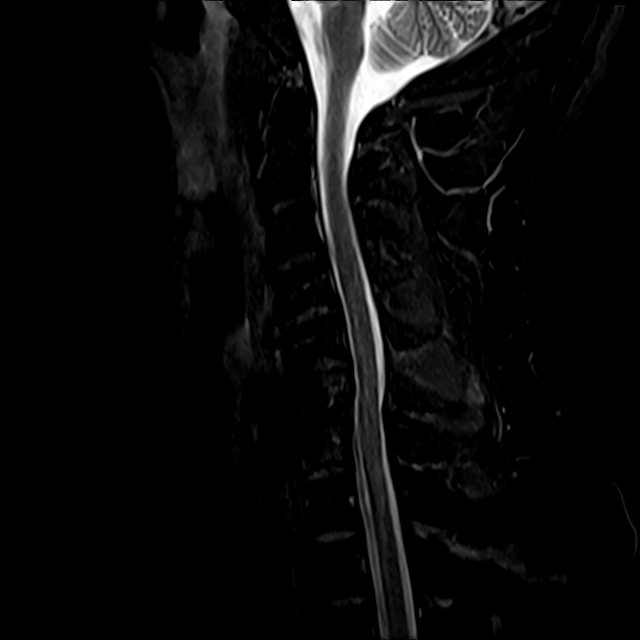
[im 15/15]
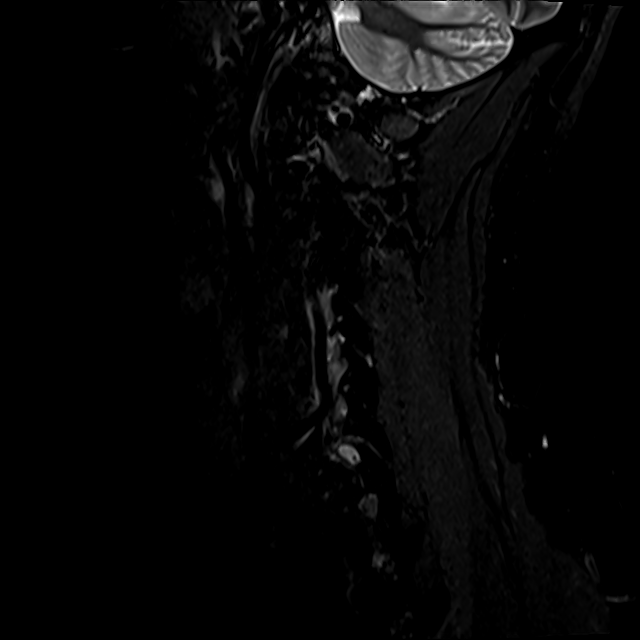

[Series 8: T2 · axial · 3.0mm · 0.50mm/px · z∈[-69,+49]mm · 9 of 38 slices shown (2 of 2)]
[im 1/38]
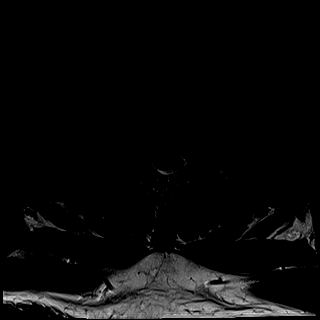
[im 6/38]
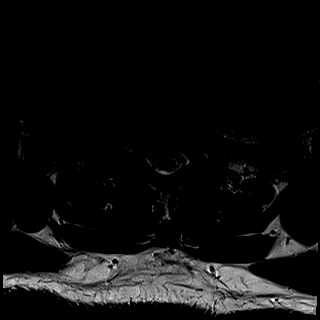
[im 11/38]
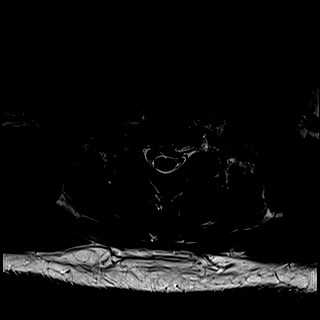
[im 16/38]
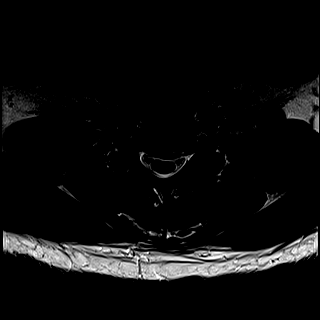
[im 19/38]
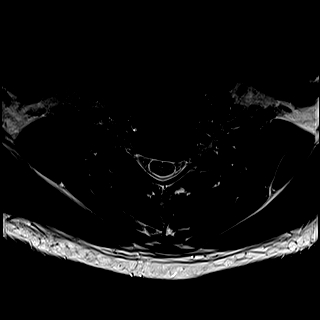
[im 22/38]
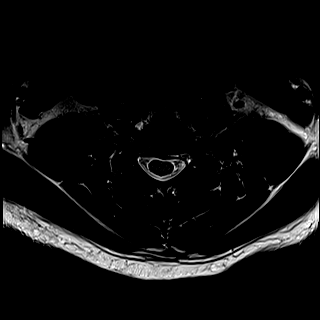
[im 27/38]
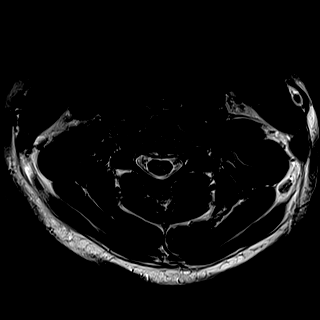
[im 32/38]
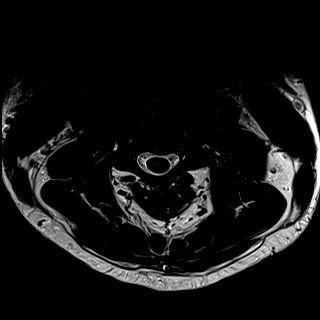
[im 38/38]
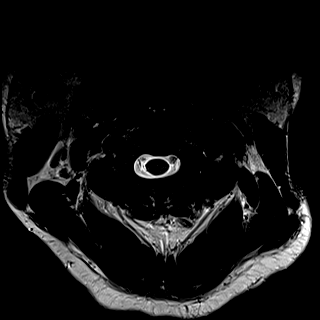

[26 of 48 positions shown; findings below may reference images not displayed]

FINDINGS: Alignment: Straightening with slight reversal of the normal cervical
lordosis, stable. No interval listhesis.

Vertebrae: Susceptibility artifact related to interval ACDF at C6-7.
Vertebral body height maintained without acute or interval fracture.
Bone marrow signal intensity within normal limits. No discrete or
worrisome osseous lesions. Suspected residual marrow edema about the
postoperative C6-7 level. No other abnormal marrow edema.

Cord: Normal signal and morphology.

Posterior Fossa, vertebral arteries, paraspinal tissues: Visualized
brain and posterior fossa within normal limits. Craniocervical
junction normal. Paraspinous and prevertebral soft tissues within
normal limits. Normal intravascular flow voids seen within the
vertebral arteries bilaterally.

Disc levels:

C2-C3: Normal interspace. Mild left-sided facet hypertrophy. No
stenosis.

C3-C4: Mild disc bulge with uncovertebral spurring. No spinal
stenosis. Mild left C4 foraminal narrowing. Right neural foramina
remains patent.

C4-C5: Broad-based right eccentric disc osteophyte complex flattens
and partially effaces the ventral thecal sac. Mild right-sided
spinal stenosis without cord impingement. Bilateral uncovertebral
spurring with resultant moderate left and mild right C5 foraminal
stenosis.

C5-C6: Diffuse disc osteophyte complex, asymmetric to the left.
Flattening of the ventral thecal sac with resultant mild left-sided
spinal stenosis. Minimal flattening of the ventral cord without cord
signal changes. Moderate left worse than right C6 foraminal
narrowing.

C6-C7: Prior fusion. Residual endplate spurring mildly indents the
left ventral thecal sac without significant stenosis. Residual
uncovertebral spurring with moderate left worse than right C7
foraminal narrowing.

C7-T1:  Normal interspace.  Minimal facet hypertrophy.  No stenosis.

T1-2: Right foraminal disc protrusion with uncovertebral spurring,
resulting in moderate right T1 foraminal stenosis. Finding could
contribute to right-sided symptoms (series 5, image 5). No spinal
stenosis.
IMPRESSION: 1. Prior ACDF at C6-7 without significant residual spinal stenosis.
Residual uncovertebral spurring with resultant moderate left worse
than right C7 foraminal stenosis.
2. Right foraminal disc protrusion with uncovertebral spurring at
T1-2 with resultant moderate right T1 foraminal stenosis. Finding
could contribute to right-sided symptoms.
3. Left eccentric disc osteophyte complex at C5-6 with resultant
mild canal and moderate left worse than right C6 foraminal stenosis.
4. Right eccentric disc osteophyte complex at C4-5 with resultant
mild canal and moderate left C5 foraminal stenosis.

## 2021-03-11 DIAGNOSIS — M5414 Radiculopathy, thoracic region: Secondary | ICD-10-CM | POA: Diagnosis not present

## 2022-05-15 ENCOUNTER — Encounter: Payer: Self-pay | Admitting: Internal Medicine

## 2022-05-15 ENCOUNTER — Ambulatory Visit (INDEPENDENT_AMBULATORY_CARE_PROVIDER_SITE_OTHER): Payer: 59 | Admitting: Internal Medicine

## 2022-05-15 VITALS — BP 158/100 | HR 83 | Temp 97.6°F | Ht 76.0 in | Wt 294.0 lb

## 2022-05-15 DIAGNOSIS — I10 Essential (primary) hypertension: Secondary | ICD-10-CM

## 2022-05-15 MED ORDER — SPIRONOLACTONE 25 MG PO TABS: 25.0000 mg | ORAL_TABLET | Freq: Every day | ORAL | 3 refills | Status: DC

## 2022-05-15 NOTE — Assessment & Plan Note (Signed)
BP Readings from Last 3 Encounters:  05/15/22 (!) 158/100  08/16/20 138/70  10/03/19 (!) 166/94   Labile and still persistently elevated Didn't like amlodipine Will continue the lisinopril/HCTZ 40/25 Will add spironolactone 25mg -- to 50 after 2 weeks
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
## 2022-06-17 ENCOUNTER — Ambulatory Visit (INDEPENDENT_AMBULATORY_CARE_PROVIDER_SITE_OTHER): Payer: 59 | Admitting: Internal Medicine

## 2022-06-17 ENCOUNTER — Encounter: Payer: Self-pay | Admitting: Internal Medicine

## 2022-06-17 VITALS — BP 154/96 | HR 90 | Temp 97.1°F | Ht 76.0 in | Wt 295.0 lb

## 2022-06-17 DIAGNOSIS — I1A0 Resistant hypertension: Secondary | ICD-10-CM | POA: Insufficient documentation

## 2022-06-17 LAB — RENAL FUNCTION PANEL
Albumin: 4.5 g/dL (ref 3.5–5.2)
BUN: 13 mg/dL (ref 6–23)
CO2: 26 mEq/L (ref 19–32)
Calcium: 9.6 mg/dL (ref 8.4–10.5)
Chloride: 94 mEq/L — ABNORMAL LOW (ref 96–112)
Creatinine, Ser: 0.9 mg/dL (ref 0.40–1.50)
GFR: 105.91 mL/min (ref >60.00)
Glucose, Bld: 233 mg/dL — ABNORMAL HIGH (ref 70–99)
Phosphorus: 3.7 mg/dL (ref 2.3–4.6)
Potassium: 4.1 mEq/L (ref 3.5–5.1)
Sodium: 131 mEq/L — ABNORMAL LOW (ref 135–145)

## 2022-06-17 LAB — LIPID PANEL
Cholesterol: 217 mg/dL — ABNORMAL HIGH (ref 0–200)
HDL: 39.7 mg/dL (ref >39.00)
NonHDL: 177.44
Total CHOL/HDL Ratio: 5
Triglycerides: 336 mg/dL — ABNORMAL HIGH (ref 0.0–149.0)
VLDL: 67.2 mg/dL — ABNORMAL HIGH (ref 0.0–40.0)

## 2022-06-17 LAB — MICROALBUMIN / CREATININE URINE RATIO
Creatinine,U: 80.5 mg/dL
Microalb Creat Ratio: 1.3 mg/g (ref 0.0–30.0)
Microalb, Ur: 1 mg/dL (ref 0.0–1.9)

## 2022-06-17 LAB — CBC
HCT: 43.8 % (ref 39.0–52.0)
Hemoglobin: 14.6 g/dL (ref 13.0–17.0)
MCHC: 33.3 g/dL (ref 30.0–36.0)
MCV: 85.5 fl (ref 78.0–100.0)
Platelets: 186 10*3/uL (ref 150.0–400.0)
RBC: 5.12 Mil/uL (ref 4.22–5.81)
RDW: 12.6 % (ref 11.5–15.5)
WBC: 5.4 10*3/uL (ref 4.0–10.5)

## 2022-06-17 LAB — HEPATIC FUNCTION PANEL
ALT: 20 U/L (ref 0–53)
AST: 19 U/L (ref 0–37)
Albumin: 4.5 g/dL (ref 3.5–5.2)
Alkaline Phosphatase: 55 U/L (ref 39–117)
Bilirubin, Direct: 0.1 mg/dL (ref 0.0–0.3)
Total Bilirubin: 1.1 mg/dL (ref 0.2–1.2)
Total Protein: 7.1 g/dL (ref 6.0–8.3)

## 2022-06-17 LAB — TSH: TSH: 1.72 u[IU]/mL (ref 0.35–5.50)

## 2022-06-17 LAB — LDL CHOLESTEROL, DIRECT: Direct LDL: 123 mg/dL

## 2022-06-17 NOTE — Assessment & Plan Note (Signed)
BP Readings from Last 3 Encounters:  06/17/22 (!) 154/96  05/15/22 (!) 158/100  08/16/20 138/70   Remains high despite lisinopril/HCTZ 40/25 and now spironolactone 50 daily Didn't tolerate amlodipine Will check labs Will give DASH info and refer to cardiology Hold off on new medication for now as no symptoms (had discussed doxazosin)
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
## 2022-06-17 NOTE — Patient Instructions (Signed)

## 2022-06-18 ENCOUNTER — Telehealth: Payer: Self-pay

## 2022-06-18 ENCOUNTER — Other Ambulatory Visit: Payer: Self-pay | Admitting: Internal Medicine

## 2022-06-18 DIAGNOSIS — R7309 Other abnormal glucose: Secondary | ICD-10-CM

## 2022-06-18 NOTE — Telephone Encounter (Signed)
Spoke to pt. Tried to make lab appt but pt said he will have to get his work schedule for next week and call back to schedule.

## 2022-06-18 NOTE — Telephone Encounter (Signed)
Left message for pt to call office. If he calls back, please relay message from Dr Silvio Pate and make a fasting lab appointment. Thanks

## 2022-06-18 NOTE — Telephone Encounter (Signed)
-----   Message from Venia Carbon, MD sent at 06/18/2022  7:31 AM EST ----- Please call  The labs just show some mild abnormalities---other than an elevated sugar that suggests diabetes. Have him come back ASAP after an overnight fast to recheck levels. This might be why his BP is staying up The mildly low sodium and chloride are related to the meds Cholesterol is mildly elevated Send copy

## 2022-06-30 ENCOUNTER — Other Ambulatory Visit (INDEPENDENT_AMBULATORY_CARE_PROVIDER_SITE_OTHER): Payer: 59

## 2022-06-30 DIAGNOSIS — R7309 Other abnormal glucose: Secondary | ICD-10-CM | POA: Diagnosis not present

## 2022-06-30 LAB — GLUCOSE, RANDOM: Glucose, Bld: 268 mg/dL — ABNORMAL HIGH (ref 70–99)

## 2022-06-30 LAB — HEMOGLOBIN A1C: Hgb A1c MFr Bld: 9.2 % — ABNORMAL HIGH (ref 4.6–6.5)

## 2022-07-02 ENCOUNTER — Encounter: Payer: 59 | Attending: Internal Medicine | Admitting: Dietician

## 2022-07-02 ENCOUNTER — Encounter: Payer: Self-pay | Admitting: Dietician

## 2022-07-02 ENCOUNTER — Ambulatory Visit (INDEPENDENT_AMBULATORY_CARE_PROVIDER_SITE_OTHER): Payer: 59 | Admitting: Internal Medicine

## 2022-07-02 ENCOUNTER — Encounter: Payer: Self-pay | Admitting: Internal Medicine

## 2022-07-02 VITALS — BP 158/90 | HR 100 | Temp 97.0°F | Ht 76.0 in | Wt 285.0 lb

## 2022-07-02 DIAGNOSIS — E1165 Type 2 diabetes mellitus with hyperglycemia: Secondary | ICD-10-CM | POA: Insufficient documentation

## 2022-07-02 DIAGNOSIS — E119 Type 2 diabetes mellitus without complications: Secondary | ICD-10-CM | POA: Diagnosis not present

## 2022-07-02 DIAGNOSIS — E1159 Type 2 diabetes mellitus with other circulatory complications: Secondary | ICD-10-CM | POA: Insufficient documentation

## 2022-07-02 MED ORDER — METFORMIN HCL ER 500 MG PO TB24: 1000.0000 mg | ORAL_TABLET | Freq: Every day | ORAL | 5 refills | Status: DC

## 2022-07-02 NOTE — Patient Instructions (Addendum)
Aim for 150 minutes of physical activity weekly. Goal: Continue walking and going to the [employer] at least 4x/wk.   Goal: aim to make 1/2 of your plate vegetables at least 2x/day.   Goal: Limit sodas/juices/sweet tea.  Rethink what you drink. Choose beverages without added sugar. Look for 0 carbs on the label.  Make simple meals at home more often than eating out.
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
## 2022-07-02 NOTE — Assessment & Plan Note (Signed)
Counseling about nutrition---sugar and carb reduction Does exercise Discussed options---I think best choices are metformin or oral semaglutide (he won't take shots) We will try metformin

## 2022-07-02 NOTE — Progress Notes (Signed)
Diabetes Self-Management Education  Visit Type: First/Initial  Appt. Start Time: 1525 Appt. End Time: 7628  07/02/2022  Mr. Keith English, identified by name and date of birth, is a 42 y.o. male with a diagnosis of Diabetes: Type 2.   ASSESSMENT  Primary concern: Diabetes and nutrition education and how to control blood glucose.   History includes: GERD, type 2 diabetes, HTN Labs noted: 06/30/22 A1c 9.2% Medications include: metformin Supplements: MVI  Pt reports he was diagnosed with diabetes today. Pt states he found out his glucose was high 2 weeks ago and made dietary and exercise changes at that point before he got his A1c checked.   Pt recently started exercising. Pt walks 5 miles in his neighborhood almost daily. Pt states he goes to the [facility] and does elliptical and treadmill and weight training.   Pt is a truck driver and works nights 5pm-5am. Some days he goes in later in which he will walk before work.  Pt states he always gets around 4-5 hours of sleep, which he has done since he was a teenager.   Pt reports he has high stress.   Pt has made recent changes to his diet including packing food for the truck, cutting out sugar sweetened beverages, buying whole wheat bread, and cooking at home more.   There were no vitals taken for this visit. There is no height or weight on file to calculate BMI.   Diabetes Self-Management Education - 07/02/22 1524       Visit Information   Visit Type First/Initial      Initial Visit   Diabetes Type Type 2    Date Diagnosed 07/02/21    Are you currently following a meal plan? No    Are you taking your medications as prescribed? Yes      Health Coping   How would you rate your overall health? Fair      Psychosocial Assessment   Patient Belief/Attitude about Diabetes Motivated to manage diabetes    What is the hardest part about your diabetes right now, causing you the most concern, or is the most worrisome to you about your  diabetes?   Making healty food and beverage choices    Self-care barriers None    Self-management support Doctor's office    Other persons present Patient    Patient Concerns Nutrition/Meal planning    Special Needs None    Preferred Learning Style No preference indicated    Learning Readiness Ready    How often do you need to have someone help you when you read instructions, pamphlets, or other written materials from your doctor or pharmacy? 1 - Never    What is the last grade level you completed in school? college      Pre-Education Assessment   Patient understands the diabetes disease and treatment process. Needs Instruction    Patient understands incorporating nutritional management into lifestyle. Needs Instruction    Patient undertands incorporating physical activity into lifestyle. Needs Instruction    Patient understands using medications safely. Needs Instruction    Patient understands monitoring blood glucose, interpreting and using results Needs Instruction    Patient understands prevention, detection, and treatment of acute complications. Needs Instruction    Patient understands prevention, detection, and treatment of chronic complications. Needs Instruction    Patient understands how to develop strategies to address psychosocial issues. Needs Instruction    Patient understands how to develop strategies to promote health/change behavior. Needs Instruction  Complications   Last HgB A1C per patient/outside source 9.2 %    How often do you check your blood sugar? 0 times/day (not testing)    Have you had a dilated eye exam in the past 12 months? Yes    Have you had a dental exam in the past 12 months? Yes    Are you checking your feet? Yes    How many days per week are you checking your feet? 3      Dietary Intake   Breakfast sausage gravy biscuit OR recently baked chicken with vegetables and rice    Snack (morning) none    Lunch mcdonalds OR wings OR pizza OR recently  packs leftovers    Snack (afternoon) lemon crackers OR nabs OR tuna packet    Dinner chips OR popcorn with juice OR recently spaghetti    Snack (evening) none    Beverage(s) juice, coffee with french vanilla cream and sugar,      Activity / Exercise   Activity / Exercise Type Light (walking / raking leaves)    How many days per week do you exercise? 6    How many minutes per day do you exercise? 60    Total minutes per week of exercise 360      Patient Education   Previous Diabetes Education No    Disease Pathophysiology Definition of diabetes, type 1 and 2, and the diagnosis of diabetes;Factors that contribute to the development of diabetes;Explored patient's options for treatment of their diabetes    Healthy Eating Role of diet in the treatment of diabetes and the relationship between the three main macronutrients and blood glucose level;Plate Method;Carbohydrate counting;Reviewed blood glucose goals for pre and post meals and how to evaluate the patients' food intake on their blood glucose level.;Food label reading, portion sizes and measuring food.;Meal timing in regards to the patients' current diabetes medication.;Information on hints to eating out and maintain blood glucose control.;Meal options for control of blood glucose level and chronic complications.    Being Active Role of exercise on diabetes management, blood pressure control and cardiac health.;Helped patient identify appropriate exercises in relation to his/her diabetes, diabetes complications and other health issue.    Medications Reviewed patients medication for diabetes, action, purpose, timing of dose and side effects.    Monitoring Daily foot exams;Yearly dilated eye exam;Identified appropriate SMBG and/or A1C goals.    Acute complications Taught prevention, symptoms, and  treatment of hypoglycemia - the 15 rule.;Discussed and identified patients' prevention, symptoms, and treatment of hyperglycemia.    Chronic  complications Relationship between chronic complications and blood glucose control;Assessed and discussed foot care and prevention of foot problems;Lipid levels, blood glucose control and heart disease;Identified and discussed with patient  current chronic complications;Dental care;Retinopathy and reason for yearly dilated eye exams;Nephropathy, what it is, prevention of, the use of ACE, ARB's and early detection of through urine microalbumia.;Reviewed with patient heart disease, higher risk of, and prevention    Diabetes Stress and Support Identified and addressed patients feelings and concerns about diabetes;Worked with patient to identify barriers to care and solutions;Role of stress on diabetes    Lifestyle and Health Coping Lifestyle issues that need to be addressed for better diabetes care      Individualized Goals (developed by patient)   Nutrition General guidelines for healthy choices and portions discussed    Physical Activity Exercise 3-5 times per week;60 minutes per day    Medications take my medication as prescribed    Monitoring  Not Applicable    Problem Solving Eating Pattern    Reducing Risk examine blood glucose patterns;do foot checks daily;treat hypoglycemia with 15 grams of carbs if blood glucose less than 70mg /dL    Health Coping Ask for help with psychological, social, or emotional issues      Post-Education Assessment   Patient understands the diabetes disease and treatment process. Comprehends key points    Patient understands incorporating nutritional management into lifestyle. Comprehends key points    Patient undertands incorporating physical activity into lifestyle. Demonstrates understanding / competency    Patient understands using medications safely. Comphrehends key points    Patient understands monitoring blood glucose, interpreting and using results Comprehends key points    Patient understands prevention, detection, and treatment of acute complications.  Comprehends key points    Patient understands prevention, detection, and treatment of chronic complications. Comprehends key points    Patient understands how to develop strategies to address psychosocial issues. Comprehends key points    Patient understands how to develop strategies to promote health/change behavior. Comprehends key points      Outcomes   Expected Outcomes Demonstrated interest in learning. Expect positive outcomes    Future DMSE 3-4 months    Program Status Not Completed             Individualized Plan for Diabetes Self-Management Training:   Learning Objective:  Patient will have a greater understanding of diabetes self-management. Patient education plan is to attend individual and/or group sessions per assessed needs and concerns.   Plan:   Patient Instructions  Aim for 150 minutes of physical activity weekly. Goal: Continue walking and going to the [employer] at least 4x/wk.   Goal: aim to make 1/2 of your plate vegetables at least 2x/day.   Goal: Limit sodas/juices/sweet tea.  Rethink what you drink. Choose beverages without added sugar. Look for 0 carbs on the label.  Make simple meals at home more often than eating out.  Expected Outcomes:  Demonstrated interest in learning. Expect positive outcomes  Education material provided: ADA - How to Thrive: A Guide for Your Journey with Diabetes and Snack sheet  If problems or questions, patient to contact team via:  Phone  Future DSME appointment: 3-4 months

## 2022-07-02 NOTE — Patient Instructions (Signed)
Please start the metformin with 1 tab (500mg ) daily with breakfast. If you have no problems with it (like diarrhea) after a week, go up to 1000mg  (2 tabs) daily with breakfast

## 2022-07-04 ENCOUNTER — Ambulatory Visit: Payer: 59 | Admitting: Internal Medicine

## 2022-07-07 ENCOUNTER — Telehealth: Payer: Self-pay | Admitting: Internal Medicine

## 2022-07-07 MED ORDER — LISINOPRIL-HYDROCHLOROTHIAZIDE 20-12.5 MG PO TABS: 2.0000 | ORAL_TABLET | Freq: Every day | ORAL | 3 refills | Status: DC

## 2022-07-07 NOTE — Telephone Encounter (Signed)
Prescription Request  07/07/2022  Is this a "Controlled Substance" medicine? No  LOV: 07/02/2022  What is the name of the medication or equipment?   lisinopril-hydrochlorothiazide (ZESTORETIC) 20-12.5 MG tablet     Have you contacted your pharmacy to request a refill? No   Which pharmacy would you like this sent to?  [id number] - [place], [address] [place] [address] Phone: [phone] Fax: [phone]      Patient notified that their request is being sent to the clinical staff for review and that they should receive a response within 2 business days.   Please advise at Mobile [phone] (mobile)   Pt stated [facility] so his insurance will cover it.

## 2022-08-01 ENCOUNTER — Telehealth: Payer: Self-pay

## 2022-08-01 NOTE — Telephone Encounter (Signed)
Called ans left message on VM for pt. We received a fax from [facility] asking for 90 day supply of spironolactone. I called to see if he has increased it to 68m.

## 2022-08-01 NOTE — Telephone Encounter (Signed)
Patient returned call,and stated that he is taking 50 mg of spironolactone. He takes 2 a day at 25 mg.

## 2022-08-04 ENCOUNTER — Other Ambulatory Visit: Payer: Self-pay

## 2022-08-04 MED ORDER — SPIRONOLACTONE 50 MG PO TABS: 50.0000 mg | ORAL_TABLET | Freq: Every day | ORAL | 1 refills | Status: DC

## 2022-08-04 NOTE — Telephone Encounter (Signed)
Noted. Send 6 month supply of spironolactone to pharmacy till his f/u appt in July 2024

## 2022-08-29 ENCOUNTER — Encounter: Payer: Self-pay | Admitting: Family Medicine

## 2022-08-29 ENCOUNTER — Ambulatory Visit (INDEPENDENT_AMBULATORY_CARE_PROVIDER_SITE_OTHER)
Admission: RE | Admit: 2022-08-29 | Discharge: 2022-08-29 | Disposition: A | Discharge status: Home / Self Care | Payer: 59 | Source: Ambulatory Visit | Attending: Family Medicine | Admitting: Family Medicine

## 2022-08-29 ENCOUNTER — Ambulatory Visit (INDEPENDENT_AMBULATORY_CARE_PROVIDER_SITE_OTHER): Payer: 59 | Admitting: Family Medicine

## 2022-08-29 VITALS — BP 138/80 | HR 84 | Temp 98.1°F | Ht 76.0 in | Wt 278.5 lb

## 2022-08-29 DIAGNOSIS — R109 Unspecified abdominal pain: Secondary | ICD-10-CM

## 2022-08-29 LAB — CBC WITH DIFFERENTIAL/PLATELET
Basophils Absolute: 0 10*3/uL (ref 0.0–0.1)
Basophils Relative: 0.5 % (ref 0.0–3.0)
Eosinophils Absolute: 0 10*3/uL (ref 0.0–0.7)
Eosinophils Relative: 0.4 % (ref 0.0–5.0)
HCT: 40.1 % (ref 39.0–52.0)
Hemoglobin: 13.2 g/dL (ref 13.0–17.0)
Lymphocytes Relative: 35.4 % (ref 12.0–46.0)
Lymphs Abs: 1.7 10*3/uL (ref 0.7–4.0)
MCHC: 32.9 g/dL (ref 30.0–36.0)
MCV: 87.1 fl (ref 78.0–100.0)
Monocytes Absolute: 0.4 10*3/uL (ref 0.1–1.0)
Monocytes Relative: 7.7 % (ref 3.0–12.0)
Neutro Abs: 2.8 10*3/uL (ref 1.4–7.7)
Neutrophils Relative %: 56 % (ref 43.0–77.0)
Platelets: 242 10*3/uL (ref 150.0–400.0)
RBC: 4.61 Mil/uL (ref 4.22–5.81)
RDW: 12.7 % (ref 11.5–15.5)
WBC: 4.9 10*3/uL (ref 4.0–10.5)

## 2022-08-29 LAB — LIPASE: Lipase: 9 U/L — ABNORMAL LOW (ref 11.0–59.0)

## 2022-08-29 LAB — COMPREHENSIVE METABOLIC PANEL
ALT: 20 U/L (ref 0–53)
AST: 15 U/L (ref 0–37)
Albumin: 4.6 g/dL (ref 3.5–5.2)
Alkaline Phosphatase: 57 U/L (ref 39–117)
BUN: 14 mg/dL (ref 6–23)
CO2: 28 mEq/L (ref 19–32)
Calcium: 9.9 mg/dL (ref 8.4–10.5)
Chloride: 102 mEq/L (ref 96–112)
Creatinine, Ser: 0.95 mg/dL (ref 0.40–1.50)
GFR: 99.12 mL/min (ref >60.00)
Glucose, Bld: 153 mg/dL — ABNORMAL HIGH (ref 70–99)
Potassium: 4 mEq/L (ref 3.5–5.1)
Sodium: 138 mEq/L (ref 135–145)
Total Bilirubin: 1 mg/dL (ref 0.2–1.2)
Total Protein: 7.8 g/dL (ref 6.0–8.3)

## 2022-08-29 NOTE — Progress Notes (Signed)
Patient ID: Keith English, male    DOB: Apr 11, 1981, 42 y.o.   MRN: [id number]  This visit was conducted in person.  BP 138/80   Pulse 84   Temp 98.1 F (36.7 C) (Temporal)   Ht 6\' 4"  (1.93 m)   Wt 278 lb 8 oz (126.3 kg)   SpO2 98%   BMI 33.90 kg/m    CC:  Chief Complaint  Patient presents with   Abdominal Pain    LUQ-right below the rib cage-First noticed it on Monday    Subjective:   HPI: Keith English is a 42 y.o. male with diabetes presenting on 08/29/2022 for Abdominal Pain (LUQ-right below the rib cage-First noticed it on Monday)  Reviewed last PCP office visit with new onset diabetes diagnosed July 02, 2022.  Started on metformin  [phone] mg p.o. daily  HAs to spread out dose given body aches Lab Results  Component Value Date   HGBA1C 9.2 (H) 06/30/2022     Today he reports  new onset pain in upper left abdomen, under rib cage... started  5 days ago. 6/10 on pain scale  Pain constant, tender to touch, lying on let side, turning, taking a deep breath.  No issue with walking.  No change with food. Some constipation earlier in week.. straining.. no looser stools, softer.  Took baking soda an water to treat.   Passing more gas.   No change pain with BM or passing gas.  No blood stool, no fever    No dysuria.   No colonoscopy.  Relevant past medical, surgical, family and social history reviewed and updated as indicated. Interim medical history since our last visit reviewed. Allergies and medications reviewed and updated. Outpatient Medications Prior to Visit  Medication Sig Dispense Refill   lisinopril-hydrochlorothiazide (ZESTORETIC) 20-12.5 MG tablet Take 2 tablets by mouth daily. 180 tablet 3   metFORMIN (GLUCOPHAGE-XR) 500 MG 24 hr tablet Take 2 tablets (1,000 mg total) by mouth daily with breakfast. 60 tablet 5   Multiple Vitamin (MULTIVITAMIN) tablet Take 1 tablet by mouth daily.     spironolactone (ALDACTONE) 50 MG tablet Take 1 tablet (50  mg total) by mouth daily. 25mg  for 2 weeks, then increase to 50mg  90 tablet 1   No facility-administered medications prior to visit.     Per HPI unless specifically indicated in ROS section below Review of Systems  Constitutional:  Negative for fatigue and fever.  HENT:  Negative for ear pain.   Eyes:  Negative for pain.  Respiratory:  Negative for cough and shortness of breath.   Cardiovascular:  Negative for chest pain, palpitations and leg swelling.  Gastrointestinal:  Negative for abdominal pain.  Genitourinary:  Negative for dysuria.  Musculoskeletal:  Negative for arthralgias.  Neurological:  Negative for syncope, light-headedness and headaches.  Psychiatric/Behavioral:  Negative for dysphoric mood.    Objective:  BP 138/80   Pulse 84   Temp 98.1 F (36.7 C) (Temporal)   Ht 6\' 4"  (1.93 m)   Wt 278 lb 8 oz (126.3 kg)   SpO2 98%   BMI 33.90 kg/m   Wt Readings from Last 3 Encounters:  08/29/22 278 lb 8 oz (126.3 kg)  07/02/22 285 lb (129.3 kg)  06/17/22 295 lb (133.8 kg)      Physical Exam Constitutional:      Appearance: He is well-developed.  HENT:     Head: Normocephalic.     Right Ear: Hearing normal.  Left Ear: Hearing normal.     Nose: Nose normal.  Neck:     Thyroid: No thyroid mass or thyromegaly.     Vascular: No carotid bruit.     Trachea: Trachea normal.  Cardiovascular:     Rate and Rhythm: Normal rate and regular rhythm.     Pulses: Normal pulses.     Heart sounds: Heart sounds not distant. No murmur heard.    No friction rub. No gallop.     Comments: No peripheral edema Pulmonary:     Effort: Pulmonary effort is normal. No respiratory distress.     Breath sounds: Normal breath sounds.  Abdominal:     General: Abdomen is protuberant. Bowel sounds are normal.     Palpations: Abdomen is soft.     Tenderness: There is no right CVA tenderness, left CVA tenderness, guarding or rebound. Negative signs include McBurney's sign.     Hernia: No  hernia is present.       Comments: Area of tenderness is primary left lateral abdominal.  Not low down or high.  Skin:    General: Skin is warm and dry.     Findings: No rash.  Psychiatric:        Speech: Speech normal.        Behavior: Behavior normal.        Thought Content: Thought content normal.       Results for orders placed or performed in visit on 08/29/22  CBC with Differential/Platelet  Result Value Ref Range   WBC 4.9 4.0 - 10.5 K/uL   RBC 4.61 4.22 - 5.81 Mil/uL   Hemoglobin 13.2 13.0 - 17.0 g/dL   HCT 40.1 39.0 - 52.0 %   MCV 87.1 78.0 - 100.0 fl   MCHC 32.9 30.0 - 36.0 g/dL   RDW 12.7 11.5 - 15.5 %   Platelets 242.0 150.0 - 400.0 K/uL   Neutrophils Relative % 56.0 43.0 - 77.0 %   Lymphocytes Relative 35.4 12.0 - 46.0 %   Monocytes Relative 7.7 3.0 - 12.0 %   Eosinophils Relative 0.4 0.0 - 5.0 %   Basophils Relative 0.5 0.0 - 3.0 %   Neutro Abs 2.8 1.4 - 7.7 K/uL   Lymphs Abs 1.7 0.7 - 4.0 K/uL   Monocytes Absolute 0.4 0.1 - 1.0 K/uL   Eosinophils Absolute 0.0 0.0 - 0.7 K/uL   Basophils Absolute 0.0 0.0 - 0.1 K/uL  Comprehensive metabolic panel  Result Value Ref Range   Sodium 138 135 - 145 mEq/L   Potassium 4.0 3.5 - 5.1 mEq/L   Chloride 102 96 - 112 mEq/L   CO2 28 19 - 32 mEq/L   Glucose, Bld 153 (H) 70 - 99 mg/dL   BUN 14 6 - 23 mg/dL   Creatinine, Ser 0.95 0.40 - 1.50 mg/dL   Total Bilirubin 1.0 0.2 - 1.2 mg/dL   Alkaline Phosphatase 57 39 - 117 U/L   AST 15 0 - 37 U/L   ALT 20 0 - 53 U/L   Total Protein 7.8 6.0 - 8.3 g/dL   Albumin 4.6 3.5 - 5.2 g/dL   GFR 99.12 >60.00 mL/min   Calcium 9.9 8.4 - 10.5 mg/dL  Lipase  Result Value Ref Range   Lipase 9.0 (L) 11.0 - 59.0 U/L    Assessment and Plan  Left lateral abdominal pain Assessment & Plan: Acute, Differential diagnosis includes constipation, gas, less likely diverticulitis or abdominal wall muscle strain. No sign or symptoms of gallbladder  issue, liver issue or appendicitis. Will  evaluate with labs including CBC, lipase and complete metabolic panel.  Will check abdominal x-ray to consider constipation.  Discussed CT scan with patient is worried about cost.  Given immediately prior to abdominal pain he was having issues with constipation I feel like this is the most likely cause of his symptoms.  I recommended increasing water and fiber in his diet slowly over time.  Start MiraLAX 17 g p.o. daily.  Return and ER precautions provided.  Orders: -     CBC with Differential/Platelet -     Comprehensive metabolic panel -     Lipase -     DG Abd 1 View; Future  Addendum: Abdominal film within normal range no suggestion of obstruction or fluid level, normal bowel gas pattern.  Also CBC c-Met and lipase all within normal range.  He will proceed as discussed above but if symptoms worsen we can consider moving forward with CT scan to rule out diverticulitis.  Patient notified via [place].  No follow-ups on file.   Eliezer Lofts, MD

## 2022-08-29 NOTE — Assessment & Plan Note (Signed)
Acute, Differential diagnosis includes constipation, gas, less likely diverticulitis or abdominal wall muscle strain. No sign or symptoms of gallbladder issue, liver issue or appendicitis. Will evaluate with labs including CBC, lipase and complete metabolic panel.  Will check abdominal x-ray to consider constipation.  Discussed CT scan with patient is worried about cost.  Given immediately prior to abdominal pain he was having issues with constipation I feel like this is the most likely cause of his symptoms.  I recommended increasing water and fiber in his diet slowly over time.  Start MiraLAX 17 g p.o. daily.  Return and ER precautions provided.

## 2022-09-11 ENCOUNTER — Ambulatory Visit: Payer: 59 | Admitting: Dietician

## 2022-10-01 ENCOUNTER — Ambulatory Visit: Payer: 59 | Admitting: Internal Medicine

## 2022-10-30 DIAGNOSIS — M5412 Radiculopathy, cervical region: Secondary | ICD-10-CM | POA: Diagnosis not present

## 2022-10-30 DIAGNOSIS — M25512 Pain in left shoulder: Secondary | ICD-10-CM | POA: Diagnosis not present

## 2023-02-20 DIAGNOSIS — N341 Nonspecific urethritis: Secondary | ICD-10-CM | POA: Diagnosis not present

## 2023-06-04 ENCOUNTER — Encounter: Payer: Self-pay | Admitting: Internal Medicine

## 2023-06-04 ENCOUNTER — Ambulatory Visit: Payer: BC Managed Care – PPO | Admitting: Internal Medicine

## 2023-06-04 VITALS — BP 158/104 | HR 90 | Temp 99.2°F | Ht 76.0 in | Wt 283.0 lb

## 2023-06-04 DIAGNOSIS — I1A0 Resistant hypertension: Secondary | ICD-10-CM | POA: Diagnosis not present

## 2023-06-04 DIAGNOSIS — E1159 Type 2 diabetes mellitus with other circulatory complications: Secondary | ICD-10-CM | POA: Diagnosis not present

## 2023-06-04 DIAGNOSIS — Z7984 Long term (current) use of oral hypoglycemic drugs: Secondary | ICD-10-CM

## 2023-06-04 DIAGNOSIS — E119 Type 2 diabetes mellitus without complications: Secondary | ICD-10-CM | POA: Insufficient documentation

## 2023-06-04 LAB — POCT GLYCOSYLATED HEMOGLOBIN (HGB A1C): Hemoglobin A1C: 7.2 % — AB (ref 4.0–5.6)

## 2023-06-04 MED ORDER — AMLODIPINE BESYLATE 5 MG PO TABS: 5.0000 mg | ORAL_TABLET | Freq: Every day | ORAL | 3 refills | Status: DC

## 2023-06-04 MED ORDER — METFORMIN HCL ER 500 MG PO TB24: 500.0000 mg | ORAL_TABLET | Freq: Every day | ORAL | 3 refills | Status: DC

## 2023-06-04 MED ORDER — SPIRONOLACTONE 50 MG PO TABS: 50.0000 mg | ORAL_TABLET | Freq: Every day | ORAL | 3 refills | Status: DC

## 2023-06-04 NOTE — Assessment & Plan Note (Signed)
Lab Results  Component Value Date   HGBA1C 7.2 (A) 06/04/2023   Has really improved his diet Metformin 500mg  daily
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
## 2023-06-04 NOTE — Assessment & Plan Note (Addendum)
BP Readings from Last 3 Encounters:  06/04/23 (!) 158/104  08/29/22 138/80  07/02/22 (!) 158/90   Ongoing issues despite the lisinopril/hydrochlorothiazide 40/25 and aldactone 50mg  Will check renin/aldo--though may be affected by the Rx Restart amlodipine 5mg  Consider doxazosin 4-8 mg or beta blocker

## 2023-06-05 LAB — COMPREHENSIVE METABOLIC PANEL
ALT: 24 U/L (ref 0–53)
AST: 21 U/L (ref 0–37)
Albumin: 5 g/dL (ref 3.5–5.2)
Alkaline Phosphatase: 53 U/L (ref 39–117)
BUN: 13 mg/dL (ref 6–23)
CO2: 28 meq/L (ref 19–32)
Calcium: 9.9 mg/dL (ref 8.4–10.5)
Chloride: 98 meq/L (ref 96–112)
Creatinine, Ser: 0.98 mg/dL (ref 0.40–1.50)
GFR: 94.98 mL/min (ref >60.00)
Glucose, Bld: 140 mg/dL — ABNORMAL HIGH (ref 70–99)
Potassium: 4.1 meq/L (ref 3.5–5.1)
Sodium: 136 meq/L (ref 135–145)
Total Bilirubin: 1.2 mg/dL (ref 0.2–1.2)
Total Protein: 7.8 g/dL (ref 6.0–8.3)

## 2023-06-05 LAB — CBC
HCT: 45.5 % (ref 39.0–52.0)
Hemoglobin: 14.7 g/dL (ref 13.0–17.0)
MCHC: 32.4 g/dL (ref 30.0–36.0)
MCV: 87.8 fL (ref 78.0–100.0)
Platelets: 228 10*3/uL (ref 150.0–400.0)
RBC: 5.18 Mil/uL (ref 4.22–5.81)
RDW: 12.8 % (ref 11.5–15.5)
WBC: 5.1 10*3/uL (ref 4.0–10.5)

## 2023-06-05 LAB — TSH: TSH: 1.72 u[IU]/mL (ref 0.35–5.50)

## 2023-06-11 LAB — ALDOSTERONE + RENIN ACTIVITY W/ RATIO
ALDO / PRA Ratio: 0.4 {ratio} — ABNORMAL LOW (ref 0.9–28.9)
Aldosterone: 4 ng/dL
Renin Activity: 9.62 ng/mL/h — ABNORMAL HIGH (ref 0.25–5.82)

## 2023-06-12 ENCOUNTER — Telehealth: Payer: Self-pay | Admitting: Internal Medicine

## 2023-06-12 ENCOUNTER — Telehealth: Payer: Self-pay

## 2023-06-12 DIAGNOSIS — I1A0 Resistant hypertension: Secondary | ICD-10-CM

## 2023-06-12 NOTE — Addendum Note (Signed)
Addended by: Tillman Abide I on: 06/12/2023 01:19 PM   Modules accepted: Orders

## 2023-06-12 NOTE — Telephone Encounter (Signed)
Discussed with him Will try to get the renal artery ultrasound to be sure no stenosis

## 2023-06-12 NOTE — Telephone Encounter (Signed)
-----   Message from Tillman Abide sent at 06/12/2023  7:33 AM EST ----- Please call The high renin suggests his high blood pressure could be related to narrowing of the arteries to his kidneys. There are some tests to check for this (ultrasound and CT) but it might be best to have him see a vascular specialist to decide about this. One of my partners (Dr Allyson Sabal in [place]) would be an appropriate person to see. Find out if he is willing to proceed with this referral

## 2023-06-12 NOTE — Telephone Encounter (Signed)
Left message to call office

## 2023-06-12 NOTE — Telephone Encounter (Signed)
Copied from CRM [id number]. Topic: Referral - Question >> Jun 12, 2023 11:12 AM [name] B wrote: Reason for CRM: Pt is calling back in to state that he did not want to proceed with the vascular specialists and would like to request a call back to discuss next steps.

## 2023-06-12 NOTE — Telephone Encounter (Signed)
Copied this in previous message about this result.

## 2023-06-22 ENCOUNTER — Telehealth: Payer: Self-pay | Admitting: *Deleted

## 2023-06-22 NOTE — Telephone Encounter (Signed)
 Copied from CRM [id number]. Topic: Clinical - Medication Question >> Jun 22, 2023  3:39 PM Melissa C wrote: Reason for CRM: patient wanted to ask Clotilda if she thought a blood pressure medication hydrolazine (spelling?) would be a good fit for him. He was doing some research and didn't know if it may be a good fit for his situation and wanted to talk it over. Please advise with patient. Thank you.

## 2023-06-23 NOTE — Telephone Encounter (Signed)
 Spoke to pt. He will think about the medication. He is currently trying to get the scan scheduled around his work schedule.

## 2023-07-03 ENCOUNTER — Ambulatory Visit (HOSPITAL_COMMUNITY)
Admission: RE | Admit: 2023-07-03 | Discharge: 2023-07-03 | Disposition: A | Discharge status: Home / Self Care | Payer: BC Managed Care – PPO | Source: Ambulatory Visit | Attending: Internal Medicine | Admitting: Internal Medicine

## 2023-07-03 DIAGNOSIS — I1A0 Resistant hypertension: Secondary | ICD-10-CM | POA: Diagnosis not present

## 2023-07-06 ENCOUNTER — Ambulatory Visit: Payer: BC Managed Care – PPO | Admitting: Internal Medicine

## 2023-07-06 ENCOUNTER — Encounter: Payer: Self-pay | Admitting: Internal Medicine

## 2023-07-06 VITALS — BP 132/86 | HR 87 | Temp 98.7°F | Ht 76.0 in | Wt 290.0 lb

## 2023-07-06 DIAGNOSIS — Z7984 Long term (current) use of oral hypoglycemic drugs: Secondary | ICD-10-CM

## 2023-07-06 DIAGNOSIS — E1159 Type 2 diabetes mellitus with other circulatory complications: Secondary | ICD-10-CM

## 2023-07-06 DIAGNOSIS — I1A0 Resistant hypertension: Secondary | ICD-10-CM

## 2023-07-06 DIAGNOSIS — E119 Type 2 diabetes mellitus without complications: Secondary | ICD-10-CM | POA: Diagnosis not present

## 2023-07-06 LAB — HM DIABETES FOOT EXAM

## 2023-07-06 MED ORDER — LISINOPRIL-HYDROCHLOROTHIAZIDE 20-12.5 MG PO TABS: 2.0000 | ORAL_TABLET | Freq: Every day | ORAL | 3 refills | Status: DC

## 2023-07-06 NOTE — Assessment & Plan Note (Signed)
Lab Results  Component Value Date   HGBA1C 7.2 (A) 06/04/2023   Doing well on the metformin Will get testing equipment
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
## 2023-07-06 NOTE — Assessment & Plan Note (Signed)
BP Readings from Last 3 Encounters:  07/06/23 132/86  06/04/23 (!) 158/104  08/29/22 138/80   Finally better with the addition of the amlodipine 5mg , lisinopril/hydrochlorothiazide 40/25 and spironolactone 50

## 2023-07-07 ENCOUNTER — Telehealth: Payer: Self-pay

## 2023-07-07 MED ORDER — LANCETS MISC. MISC: 1.0000 | Freq: Every day | 3 refills | Status: AC

## 2023-07-07 MED ORDER — LANCET DEVICE MISC: 1.0000 | Freq: Every day | 0 refills | Status: AC

## 2023-07-07 MED ORDER — BLOOD GLUCOSE MONITORING SUPPL DEVI: 1.0000 | Freq: Three times a day (TID) | 0 refills | Status: DC

## 2023-07-07 MED ORDER — BLOOD GLUCOSE TEST VI STRP: 1.0000 | ORAL_STRIP | Freq: Every day | 3 refills | Status: DC

## 2023-07-07 NOTE — Telephone Encounter (Signed)
Rx sent electronically.  

## 2023-07-09 ENCOUNTER — Encounter (HOSPITAL_COMMUNITY): Payer: BC Managed Care – PPO

## 2024-01-05 ENCOUNTER — Encounter: Payer: Self-pay | Admitting: Internal Medicine

## 2024-01-05 ENCOUNTER — Ambulatory Visit (INDEPENDENT_AMBULATORY_CARE_PROVIDER_SITE_OTHER): Payer: BC Managed Care – PPO | Admitting: Internal Medicine

## 2024-01-05 ENCOUNTER — Ambulatory Visit: Payer: Self-pay | Admitting: Internal Medicine

## 2024-01-05 VITALS — BP 150/100 | HR 93 | Temp 98.5°F | Ht 74.5 in | Wt 286.0 lb

## 2024-01-05 DIAGNOSIS — Z0000 Encounter for general adult medical examination without abnormal findings: Secondary | ICD-10-CM | POA: Diagnosis not present

## 2024-01-05 DIAGNOSIS — I1A0 Resistant hypertension: Secondary | ICD-10-CM | POA: Diagnosis not present

## 2024-01-05 DIAGNOSIS — M791 Myalgia, unspecified site: Secondary | ICD-10-CM | POA: Diagnosis not present

## 2024-01-05 DIAGNOSIS — E1159 Type 2 diabetes mellitus with other circulatory complications: Secondary | ICD-10-CM

## 2024-01-05 DIAGNOSIS — E119 Type 2 diabetes mellitus without complications: Secondary | ICD-10-CM

## 2024-01-05 DIAGNOSIS — Z7984 Long term (current) use of oral hypoglycemic drugs: Secondary | ICD-10-CM

## 2024-01-05 LAB — RENAL FUNCTION PANEL
Albumin: 4.7 g/dL (ref 3.5–5.2)
BUN: 9 mg/dL (ref 6–23)
CO2: 29 meq/L (ref 19–32)
Calcium: 9.7 mg/dL (ref 8.4–10.5)
Chloride: 99 meq/L (ref 96–112)
Creatinine, Ser: 0.99 mg/dL (ref 0.40–1.50)
GFR: 93.44 mL/min (ref >60.00)
Glucose, Bld: 264 mg/dL — ABNORMAL HIGH (ref 70–99)
Phosphorus: 3.7 mg/dL (ref 2.3–4.6)
Potassium: 4.3 meq/L (ref 3.5–5.1)
Sodium: 135 meq/L (ref 135–145)

## 2024-01-05 LAB — HEPATIC FUNCTION PANEL
ALT: 16 U/L (ref 0–53)
AST: 15 U/L (ref 0–37)
Albumin: 4.7 g/dL (ref 3.5–5.2)
Alkaline Phosphatase: 63 U/L (ref 39–117)
Bilirubin, Direct: 0.1 mg/dL (ref 0.0–0.3)
Total Bilirubin: 0.8 mg/dL (ref 0.2–1.2)
Total Protein: 7.2 g/dL (ref 6.0–8.3)

## 2024-01-05 LAB — LIPID PANEL
Cholesterol: 206 mg/dL — ABNORMAL HIGH (ref 0–200)
HDL: 39.3 mg/dL (ref >39.00)
LDL Cholesterol: 107 mg/dL — ABNORMAL HIGH (ref 0–99)
NonHDL: 166.79
Total CHOL/HDL Ratio: 5
Triglycerides: 297 mg/dL — ABNORMAL HIGH (ref 0.0–149.0)
VLDL: 59.4 mg/dL — ABNORMAL HIGH (ref 0.0–40.0)

## 2024-01-05 LAB — CBC
HCT: 43.9 % (ref 39.0–52.0)
Hemoglobin: 14.3 g/dL (ref 13.0–17.0)
MCHC: 32.7 g/dL (ref 30.0–36.0)
MCV: 86.2 fl (ref 78.0–100.0)
Platelets: 204 K/uL (ref 150.0–400.0)
RBC: 5.09 Mil/uL (ref 4.22–5.81)
RDW: 12.6 % (ref 11.5–15.5)
WBC: 4.1 K/uL (ref 4.0–10.5)

## 2024-01-05 LAB — HEMOGLOBIN A1C: Hgb A1c MFr Bld: 8.2 % — ABNORMAL HIGH (ref 4.6–6.5)

## 2024-01-05 LAB — SEDIMENTATION RATE: Sed Rate: 4 mm/h (ref 0–15)

## 2024-01-05 LAB — HM DIABETES FOOT EXAM

## 2024-01-05 LAB — CK: Total CK: 258 U/L — ABNORMAL HIGH (ref 17–232)

## 2024-01-05 NOTE — Assessment & Plan Note (Signed)
 Seems to still have good control on metformin  500mg  daily

## 2024-01-05 NOTE — Assessment & Plan Note (Signed)
 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

## 2024-01-05 NOTE — Assessment & Plan Note (Signed)
 BP Readings from Last 3 Encounters:  01/05/24 (!) 150/100  07/06/23 132/86  06/04/23 (!) 158/104   Has been rotating meds due to myalgia---will check labs Try taking amlodipine  at bedtime Lisinopril /hydrochlorothiazide  40/25 and spironolactone  50 in AM
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
## 2024-01-06 NOTE — Telephone Encounter (Signed)
 Copied from CRM [id number]. Topic: Clinical - Lab/Test Results >> Jan 05, 2024  3:57 PM Donna BRAVO wrote: Reason for CRM: patient returning call form Clotilda.

## 2024-01-12 ENCOUNTER — Other Ambulatory Visit: Payer: Self-pay

## 2024-01-13 ENCOUNTER — Other Ambulatory Visit: Payer: Self-pay

## 2024-01-13 MED ORDER — METFORMIN HCL ER 500 MG PO TB24: 1000.0000 mg | ORAL_TABLET | Freq: Two times a day (BID) | ORAL | 3 refills | Status: DC

## 2024-04-08 ENCOUNTER — Ambulatory Visit

## 2024-04-18 DIAGNOSIS — R369 Urethral discharge, unspecified: Secondary | ICD-10-CM | POA: Diagnosis not present

## 2024-06-23 ENCOUNTER — Telehealth: Payer: Self-pay

## 2024-06-23 NOTE — Telephone Encounter (Signed)
 Copied from CRM [id number]. Topic: Clinical - Medical Advice >> Jun 23, 2024  3:32 PM Carlyon D wrote: Reason for CRM: Pt is calling asking to speak to miss Tesla Bochicchio he wanting to know if he should still come in to check his blood sugar as he gets it done with Zaydin Billey. He would like miss Clotilda to give him a call back [phone]

## 2024-06-23 NOTE — Telephone Encounter (Signed)
 Spoke to pt. Advised him that labs can be done at his 07-07-24 OV with Dr Bennett.

## 2024-07-07 ENCOUNTER — Ambulatory Visit: Payer: Self-pay

## 2024-07-07 ENCOUNTER — Ambulatory Visit

## 2024-07-07 VITALS — BP 138/88 | HR 100 | Temp 98.3°F | Ht 76.0 in | Wt 277.0 lb

## 2024-07-07 DIAGNOSIS — Z7984 Long term (current) use of oral hypoglycemic drugs: Secondary | ICD-10-CM

## 2024-07-07 DIAGNOSIS — E119 Type 2 diabetes mellitus without complications: Secondary | ICD-10-CM

## 2024-07-07 DIAGNOSIS — I10 Essential (primary) hypertension: Secondary | ICD-10-CM | POA: Diagnosis not present

## 2024-07-07 DIAGNOSIS — E669 Obesity, unspecified: Secondary | ICD-10-CM | POA: Insufficient documentation

## 2024-07-07 LAB — POCT GLYCOSYLATED HEMOGLOBIN (HGB A1C): Hemoglobin A1C: 9.9 % — AB (ref 4.0–5.6)

## 2024-07-07 MED ORDER — TIRZEPATIDE 2.5 MG/0.5ML ~~LOC~~ SOAJ: 2.5000 mg | SUBCUTANEOUS | 0 refills | Status: DC

## 2024-07-07 MED ORDER — CARVEDILOL 6.25 MG PO TABS: 6.2500 mg | ORAL_TABLET | Freq: Two times a day (BID) | ORAL | 0 refills | Status: AC

## 2024-07-07 MED ORDER — LISINOPRIL 40 MG PO TABS: 40.0000 mg | ORAL_TABLET | Freq: Every day | ORAL | 0 refills | Status: AC

## 2024-07-07 MED ORDER — EMPAGLIFLOZIN 10 MG PO TABS: 10.0000 mg | ORAL_TABLET | Freq: Every day | ORAL | 3 refills | Status: AC

## 2024-07-07 NOTE — Progress Notes (Signed)
 "  Subjective:   This visit was conducted in person. The patient gave informed consent to the use of Abridge AI technology to record the contents of the encounter as documented below.   Patient ID: Keith English, male    DOB: [birth year], 44 y.o.   MRN: [id number]   Discussed the use of AI scribe software for clinical note transcription with the patient, who gave verbal consent to proceed.  History of Present Illness Keith English is a 44 year old male with hypertension and diabetes who presents for medication management and blood pressure monitoring.  He has a history of hypertension that has been difficult to control. He checks his blood pressure at home twice a week, noting morning spikes to around 150/105 mmHg, which stabilize to 138/88 mmHg or 140/90 mmHg in the evenings. He was taking lisinopril  and hydrochlorothiazide  twice daily, and amlodipine  once daily before bed prior to this visit. Blood pressure readings are higher in the morning and improve as the day progresses.  He has diabetes and takes metformin , one and a half pills daily, but experiences gastrointestinal side effects such as diarrhea. He does not have test strips or a monitor for checking blood sugar levels at home due to cost. His last A1c was 9.9%, higher than the 8.2% recorded six months ago. He follows dietary recommendations from a diabetic educator, focusing on eating before 8:30 PM, reducing fast food intake, and increasing vegetable consumption.  He has been on spironolactone , which he is seeking a refill for, and also takes a multivitamin. He reports swelling in his ankles when taking amlodipine . He has lost weight over the past year, from 290 lbs to 277 lbs, attributing this to dietary changes. He is not currently exercising due to cold weather but plans to resume walking when it warms up.  He is reducing salt intake significantly, opting for low sodium options and avoiding adding extra salt to meals. He consumes  whole wheat bread and avoids white bread. No current abdominal pain and no history of herpes infection.      Review of Systems  All other systems reviewed and are negative.       Allergies[1]  Medications Ordered Prior to Encounter[2]  BP 138/88 (BP Location: Left Arm, Patient Position: Sitting, Cuff Size: Large)   Pulse 100   Temp 98.3 F (36.8 C) (Oral)   Ht 6' 4 (1.93 m)   Wt 277 lb (125.6 kg)   SpO2 95%   BMI 33.72 kg/m   Objective:      Physical Exam VITALS: P- 100, BP- 138/88 MEASUREMENTS: Weight- 277. GENERAL: Alert, cooperative, well developed, no acute distress. Obese HEAD: Normocephalic atraumatic. EYES: Extraocular movements intact BL, pupils round, equal and reactive to light BL, conjunctivae normal BL. EXTREMITIES: No cyanosis or edema. NEUROLOGICAL: Oriented to person, place and time, no gait abnormalities, moves all extremities without gross motor or sensory deficit.         Assessment & Plan:    Assessment & Plan Type 2 diabetes mellitus Poorly controlled with A1c of 9.9. Metformin  poorly tolerated. Mounjaro  discussed for glycemic control, weight loss, cardiovascular and renal benefits. Jardiance  added for renal protection. Insurance coverage for Mounjaro  discussed. - Discontinued metformin . - Initiated Mounjaro  with prior authorization pending. - Started Jardiance  10 mg once daily. - Started lisinopril  40 mg for renal protection - Scheduled follow-up in three months to reassess A1c and medication efficacy.  Essential hypertension BP in office 138/88.  Home BP readings reportedly 140s-150s,  per chart review, she has had difficult to control hypertension, elevated renin aldosterone ratio, but no renal artery stenosis on imaging. Adjusted regimen to improve control and reduce pill burden.  Zestoretic , spironolactone  and amlodipine  discontinued.  Lisinopril  and Coreg  added. Home monitoring and dietary modifications discussed. - Discontinued  spironolactone  and amlodipine . - Started lisinopril  40 mg, discontinued Zestoretic  given complaints of excessive diuresis - Started Coreg  6.25 mg (carvedilol ) twice daily. - Instructed to monitor blood pressure daily for five days and report readings. - Provided DASH diet recommendations. - Scheduled follow-up in one week to reassess blood pressure control.  Obesity Weight loss from 290 lbs to 277 lbs. Discussed weight loss benefits for blood pressure and diabetes control. Encouraged dietary modifications and increased physical activity. Mounjaro  benefits and side effects discussed. - Continue dietary modifications as per DASH diet. - Encouraged increased physical activity as weather permits. - Initiated Mounjaro  for weight loss and diabetes management.      Return in about 1 week (around 07/14/2024) for Blood pressure, then 31mo from now for Diabetes then 01/04/2025 for CPE.   Keith English Keith [name], MD  07/07/24     Contains text generated by Abridge.        [1] No Known Allergies [2]  Current Outpatient Medications on File Prior to Visit  Medication Sig Dispense Refill   Multiple Vitamin (MULTIVITAMIN) tablet Take 1 tablet by mouth daily.     No current facility-administered medications on file prior to visit.   "

## 2024-07-07 NOTE — Patient Instructions (Addendum)
 Thank you for visiting [facility] today! Here's what we talked about: - START checking BP daily for 5 days and send readings  - STOP Metformin , Amlodipine , Spironolactone  - START Jardiance , Lisinopril  and Coreg 

## 2024-07-08 LAB — MICROALBUMIN / CREATININE URINE RATIO
Creatinine,U: 163 mg/dL
Microalb Creat Ratio: 11.3 mg/g (ref 0.0–30.0)
Microalb, Ur: 1.8 mg/dL (ref 0.7–1.9)

## 2024-07-12 ENCOUNTER — Other Ambulatory Visit (HOSPITAL_COMMUNITY): Payer: Self-pay

## 2024-07-12 ENCOUNTER — Telehealth: Payer: Self-pay

## 2024-07-12 NOTE — Telephone Encounter (Signed)
 Pharmacy Patient Advocate Encounter   Received notification from [facility] KEY that prior authorization for Mounjaro  is required/requested.   Insurance verification completed.   The patient is insured through [facility].   Per test claim: PA required; PA submitted to above mentioned insurance via Latent Key/confirmation #/EOC [id number] Status is pending

## 2024-07-13 ENCOUNTER — Telehealth: Payer: Self-pay

## 2024-07-13 NOTE — Telephone Encounter (Signed)
 Copied from CRM #[id number]. Topic: General - Other >> Jul 13, 2024  9:29 AM Herma G wrote: Reason for CRM: Pt called to relay bp readings over the last few days like he was asked: Friday 141/91, Monday 147/93, Tuesday 144/91.

## 2024-07-13 NOTE — Telephone Encounter (Signed)
 Please call patient and instruct to increase to taking 2 tablets of the Coreg  twice daily.  He should send blood pressure readings after 5 days.  Thanks

## 2024-07-13 NOTE — Telephone Encounter (Signed)
 Called and spoke to pt. He will start that tonight. He has an OV here on 07-15-24.

## 2024-07-14 ENCOUNTER — Other Ambulatory Visit (HOSPITAL_COMMUNITY): Payer: Self-pay

## 2024-07-14 NOTE — Telephone Encounter (Signed)
 Pharmacy Patient Advocate Encounter  Received notification from [facility] that Prior Authorization for has been APPROVED from 07/11/2024 to 07/12/2025. Ran test claim, Copay is $25. This test claim was processed through [facility]- copay amounts may vary at other pharmacies due to pharmacy/plan contracts, or as the patient moves through the different stages of their insurance plan.   PA #/Case ID/Reference #: [id number]

## 2024-07-15 ENCOUNTER — Ambulatory Visit

## 2024-07-15 ENCOUNTER — Telehealth: Payer: Self-pay

## 2024-07-15 VITALS — BP 122/78 | HR 90 | Temp 99.3°F | Ht 76.0 in | Wt 276.0 lb

## 2024-07-15 DIAGNOSIS — E119 Type 2 diabetes mellitus without complications: Secondary | ICD-10-CM | POA: Diagnosis not present

## 2024-07-15 DIAGNOSIS — Z7984 Long term (current) use of oral hypoglycemic drugs: Secondary | ICD-10-CM | POA: Diagnosis not present

## 2024-07-15 DIAGNOSIS — I10 Essential (primary) hypertension: Secondary | ICD-10-CM | POA: Diagnosis not present

## 2024-07-15 DIAGNOSIS — E669 Obesity, unspecified: Secondary | ICD-10-CM

## 2024-07-15 NOTE — Progress Notes (Signed)
 "  Subjective:   This visit was conducted in person. The patient gave informed consent to the use of Abridge AI technology to record the contents of the encounter as documented below.   Patient ID: Keith English, male    DOB: 07-10-1980, 44 y.o.   MRN: [id number]   Keith English is a very pleasant 44 y.o. male who presents today for HTN.  Discussed the use of AI scribe software for clinical note transcription with the patient, who gave verbal consent to proceed.  History of Present Illness Keith English is a 44 year old male with hypertension who presents for blood pressure management.  He has been monitoring his blood pressure at home, with recent readings of 139/80 mmHg, 141/79 mmHg, and 122/78 mmHg. He takes carvedilol , and about 15 minutes after taking his morning dose, he experiences a throbbing sensation in his right arm, particularly noticeable when he sleeps, which lasts for about an hour. No associated dizziness or changes in sleep patterns. He continues to take lisinopril  40 mg daily without changes.  He has made dietary changes, reducing fast food intake and increasing fruits and vegetables, although he occasionally consumes fast food. 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  He has diabetes. He is awaiting the start of Mounjaro  injections, which have been approved by his insurance.   -Denies headaches, chest pain, vision changes or SOB.  Home BP readings: 130s-140s systolic/80s  Current medication regimen: Lisinopril  40mg  daily, Carvedilol  12.5mg  BID  BMP: ordered today to be done in 1 week  Additional risk factors: HLD and A1c  Diet: Trying to follow DASH diet, improving  Exercise: Impeded due to weather         Past Medical History:  Diagnosis Date   Chronic seasonal allergic rhinitis due to pollen    eyes are mostly affected   GERD (gastroesophageal reflux disease)     Hypertension     Social History   Socioeconomic History   Marital status: Single    Spouse name: [name]   Number of children: 2   Years of education: 13   Highest education level: Not on file  Occupational History   Occupation: drives tanker truck    Comment:    Tobacco Use   Smoking status: Never    Passive exposure: Past   Smokeless tobacco: Never  Substance and Sexual Activity   Alcohol use: Yes    Alcohol/week: 1.0 standard drink of alcohol    Types: 1 Glasses of wine per week   Drug use: No   Sexual activity: Yes    Partners: Female    Comment: 4 in 12 months  Other Topics Concern   Not on file  Social History Narrative   Lives with children   Shares custody but he has primary custody   Social Drivers of Health   Tobacco Use: Low Risk (07/07/2024)   Patient History    Smoking Tobacco Use: Never    Smokeless Tobacco Use: Never    Passive Exposure: Past  Financial Resource Strain: Not on file  Food Insecurity: Not on file  Transportation Needs: Not on file  Physical Activity: Not on file  Stress: Not on file  Social Connections: Unknown (10/18/2021)   Received from [facility]   Social Network    Social Network: Not on file  Intimate Partner Violence: Unknown (09/16/2021)   Received from [facility]  HITS    Physically Hurt: Not on file    Insult or Talk Down To: Not on file    Threaten Physical Harm: Not on file    Scream or Curse: Not on file  Depression (PHQ2-9): Low Risk (07/07/2024)   Depression (PHQ2-9)    PHQ-2 Score: 0  Alcohol Screen: Not on file  Housing: Not on file  Utilities: Not on file  Health Literacy: Not on file    Allergies[1]  Medications Ordered Prior to Encounter[2]  BP 122/78 (BP Location: Right Arm, Patient Position: Sitting, Cuff Size: Large)   Pulse 90   Temp 99.3 F (37.4 C) (Oral)   Ht 6' 4 (1.93 m)   Wt 276 lb (125.2 kg)   SpO2 95%   BMI 33.60 kg/m    Objective:     Physical Exam VITALS: BP-  122/78 GENERAL: Alert, cooperative, well developed, no acute distress. HEAD: Normocephalic atraumatic. EYES: Extraocular movements intact BL, pupils round, equal and reactive to light BL, conjunctivae normal BL. EARS: Tympanic membrane, ear canal and external ear normal BL. NOSE: No congestion or rhinorrhea, mucous membranes are moist. THROAT: No oropharyngeal exudate or posterior oropharyngeal erythema. CARDIOVASCULAR: Normal heart rate and rhythm, S1 and S2 normal without murmurs. CHEST: Clear to auscultation bilaterally, no wheezes, rhonchi, or crackles. ABDOMEN: Soft, non tender, non distended, without organomegaly, normal bowel sounds. EXTREMITIES: No cyanosis or edema. NEUROLOGICAL: Oriented to person, place and time, no gait abnormalities, moves all extremities without gross motor or sensory deficit.           Assessment & Plan:    Assessment & Plan Essential hypertension Recent home BP readings 130s-140s over 80s to 90s. Arm throbbing reported post-carvedilol , atypical side effect, unclear etiology, but would monitor for now.  If persisting would look for alternative etiologies.  Of note, carvedilol  was just increased 2 days ago, will refrain from further augmentation for now, pending more home BP readings.  - Continue carvedilol  2 tablets twice  (12.5 mg) daily. - Monitor blood pressure for 7 days, send readings for review. - Continue lisinopril  40 mg daily. - Adhere to DASH diet: more fruits, vegetables, less salt, processed foods. - Encourage physical activity: walking, resistance bands, weights. - BMP planned in 1 week.  Type 2 DM Obesity Was recently started on Jardiance  and Mounjaro , discussed cholesterol management due to comorbid DM. Prefers minimal medication, declined statin. Emphasized diet and lifestyle changes. Mounjaro  injection approved, dose escalation planned. Current BMI of 33, he has lost 2 pounds over the past week.  Patient's ultimate goal is to  eventually get off Mounjaro . -Rediscuss cholesterol medication in future. - Continue dietary modifications, lifestyle changes. - Start Mounjaro  injection; escalate dose from 2.5 mg to 5 mg after 4 weeks, uptitrate as needed.       No follow-ups on file.  Ivory Bail K Kaniesha Barile, MD  07/15/24    Contains text generated by [employer].        [1] No Known Allergies [2]  Current Outpatient Medications on File Prior to Visit  Medication Sig Dispense Refill   carvedilol  (COREG ) 6.25 MG tablet Take 1 tablet (6.25 mg total) by mouth 2 (two) times daily with a meal. 180 tablet 0   empagliflozin  (JARDIANCE ) 10 MG TABS tablet Take 1 tablet (10 mg total) by mouth daily. 90 tablet 3   lisinopril  (ZESTRIL ) 40 MG tablet Take 1 tablet (40 mg total) by mouth daily. 90 tablet 0   Multiple Vitamin (MULTIVITAMIN) tablet Take 1  tablet by mouth daily.     tirzepatide  (MOUNJARO ) 2.5 MG/0.5ML Pen Inject 2.5 mg into the skin once a week for 4 doses. (Patient not taking: Reported on 07/15/2024) 2 mL 0   No current facility-administered medications on file prior to visit.   "

## 2024-07-15 NOTE — Telephone Encounter (Signed)
 Copied from CRM #[id number]. Topic: Clinical - Prescription Issue >> Jul 15, 2024 12:11 PM Darshell M wrote: Reason for CRM: patient went to pick up Mounjaro  and the cost was $1168. Provider advised patient she could send the prescription to [facility] if the price was above $25. Patient calling to have prescription sent to a [facility] pharmacy. Patient CB# [phone].

## 2024-07-15 NOTE — Telephone Encounter (Signed)
 Spoke with pt asking which [facility] pharmacy he prefers to use. Pt requests rx be sent to  [facility] @ [facility] [address].   Updated pt's pharmacy list.

## 2024-07-18 ENCOUNTER — Other Ambulatory Visit (HOSPITAL_COMMUNITY): Payer: Self-pay

## 2024-07-18 MED ORDER — TIRZEPATIDE 2.5 MG/0.5ML ~~LOC~~ SOAJ
2.5000 mg | SUBCUTANEOUS | 0 refills | Status: AC
  Filled 2024-07-18: qty 2, 28d supply, fill #0

## 2024-07-18 NOTE — Telephone Encounter (Signed)
Noted, sent.

## 2024-07-19 ENCOUNTER — Other Ambulatory Visit (HOSPITAL_COMMUNITY): Payer: Self-pay

## 2024-07-20 ENCOUNTER — Other Ambulatory Visit (HOSPITAL_COMMUNITY): Payer: Self-pay

## 2024-07-22 ENCOUNTER — Other Ambulatory Visit

## 2024-07-22 DIAGNOSIS — I10 Essential (primary) hypertension: Secondary | ICD-10-CM

## 2024-07-22 NOTE — Addendum Note (Signed)
 Addended by: HOPE VEVA PARAS on: 07/22/2024 03:34 PM   Modules accepted: Orders

## 2024-10-06 ENCOUNTER — Ambulatory Visit

## 2024-12-30 ENCOUNTER — Other Ambulatory Visit

## 2025-01-06 ENCOUNTER — Encounter
